# Patient Record
Sex: Female | Born: 1990 | Race: White | Hispanic: No | Marital: Single | Smoking: Never smoker
Health system: Southern US, Community
[De-identification: ages and names within clinical notes are randomized; demographics above are authoritative.]

## PROBLEM LIST (undated history)

## (undated) DIAGNOSIS — J069 Acute upper respiratory infection, unspecified: Secondary | ICD-10-CM

## (undated) DIAGNOSIS — N83209 Unspecified ovarian cyst, unspecified side: Secondary | ICD-10-CM

## (undated) DIAGNOSIS — D649 Anemia, unspecified: Secondary | ICD-10-CM

## (undated) DIAGNOSIS — K219 Gastro-esophageal reflux disease without esophagitis: Secondary | ICD-10-CM

## (undated) HISTORY — DX: Unspecified ovarian cyst, unspecified side: N83.209

## (undated) HISTORY — DX: Anemia, unspecified: D64.9

## (undated) HISTORY — DX: Gastro-esophageal reflux disease without esophagitis: K21.9

## (undated) HISTORY — DX: Acute upper respiratory infection, unspecified: J06.9

---
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
## 2017-02-07 ENCOUNTER — Encounter: Payer: Self-pay | Admitting: Allergy and Immunology

## 2017-02-07 ENCOUNTER — Ambulatory Visit (INDEPENDENT_AMBULATORY_CARE_PROVIDER_SITE_OTHER): Payer: Commercial Managed Care - PPO | Admitting: Allergy and Immunology

## 2017-02-07 VITALS — BP 118/66 | HR 72 | Temp 98.5°F | Resp 16 | Ht 66.5 in | Wt 129.8 lb

## 2017-02-07 DIAGNOSIS — K219 Gastro-esophageal reflux disease without esophagitis: Secondary | ICD-10-CM | POA: Diagnosis not present

## 2017-02-07 DIAGNOSIS — J3089 Other allergic rhinitis: Secondary | ICD-10-CM

## 2017-02-07 DIAGNOSIS — B999 Unspecified infectious disease: Secondary | ICD-10-CM

## 2017-02-07 DIAGNOSIS — J329 Chronic sinusitis, unspecified: Secondary | ICD-10-CM | POA: Diagnosis not present

## 2017-02-07 MED ORDER — PANTOPRAZOLE SODIUM 40 MG PO TBEC: 40.0000 mg | DELAYED_RELEASE_TABLET | Freq: Every day | ORAL | 5 refills | Status: DC

## 2017-02-07 MED ORDER — RANITIDINE HCL 300 MG PO TABS: 300.0000 mg | ORAL_TABLET | Freq: Every day | ORAL | 5 refills | Status: DC

## 2017-02-07 NOTE — Progress Notes (Signed)
NEW PATIENT NOTE  Referring Provider: No ref. provider found Primary Provider: Lucianne Lei, MD Date of office visit: 02/07/2017    Subjective:   Chief Complaint:  Anita Woods (DOB: 1991/03/10) is a 26 y.o. female who presents to the clinic on 02/07/2017 with a chief complaint of Sinus Problem (runny nose, chronic sinus infections, facial pain, headaces, PND); Ear Problem (itchy and hurting); and Eye Problem (watery and itchy) .  HPI: Anita Woods presents to this clinic in evaluation of persistent airway problems. She has a long history of "chronic sinusitis" manifested as recurrent episodes of sore throat and nasal congestion with mouth breathing and yellow red nasal discharge and fatigue that appears to occur about every 3 months for which she is usually treated with Kenalog and possibly some antibiotics. She's been stuck in this pattern for at least 6 years. In between these episodes she always has runny nose and some sneezing and postnasal drip. She also has intermittent sore throat. She has intermittent ear pain bilaterally without any tinnitus or hearing loss or vertigo. There is not a tremendous amount of lower airway symptoms associated with this issue. There is no obvious provoking factor giving rise to this issue.  She also has reflux that has become quite significant recently. She has reflux up into her throat even though she takes pantoprazole daily. She must add in tums. She burps a lot. She has 2 caffeinated drinks per day and does not eat chocolate or drink alcohol.  Over the course of the past 6 months or so she has had very irregular menstrual periods. She spots a lot and then she can have a very heavy flow for 2 weeks. She did stop a estrogen IUD in April. She is sexually active utilizing a condom.  Past Medical History:  Diagnosis Date  . GERD (gastroesophageal reflux disease)   . Recurrent upper respiratory infection (URI)     Past Surgical History:  Procedure  Laterality Date  . GALLBLADDER SURGERY  2011  . TONSILLECTOMY  2001    Allergies as of 02/07/2017   No Known Allergies     Medication List      cefdinir 300 MG capsule Commonly known as:  OMNICEF Take 300 mg by mouth 2 (two) times daily.   fluticasone 50 MCG/ACT nasal spray Commonly known as:  FLONASE Place 2 sprays into both nostrils 2 (two) times daily.   loratadine 10 MG tablet Commonly known as:  CLARITIN Take 10 mg by mouth daily.   montelukast 10 MG tablet Commonly known as:  SINGULAIR Take 10 mg by mouth at bedtime.   VITAMIN D2 PO Take 1.25 mg by mouth every 30 (thirty) days.       Review of systems negative except as noted in HPI / PMHx or noted below:  Review of Systems  Constitutional: Negative.   HENT: Negative.   Eyes: Negative.   Respiratory: Negative.   Cardiovascular: Negative.   Gastrointestinal: Negative.   Genitourinary: Negative.   Musculoskeletal: Negative.   Skin: Negative.   Neurological: Negative.   Endo/Heme/Allergies: Negative.   Psychiatric/Behavioral: Negative.     Family History  Problem Relation Age of Onset  . Diabetes Mother   . High blood pressure Father   . Migraines Paternal Aunt   . Cancer Paternal Aunt   . Diabetes Maternal Grandmother     Social History   Social History  . Marital status: Single    Spouse name: N/A  . Number of children: N/A  .  Years of education: N/A   Occupational History  . Not on file.   Social History Main Topics  . Smoking status: Never Smoker  . Smokeless tobacco: Never Used  . Alcohol use No  . Drug use: No  . Sexual activity: Not on file   Other Topics Concern  . Not on file   Social History Narrative  . No narrative on file    Environmental and Social history  Lives in a house with a dry environment, 2 cats located inside the household, carpeting in the bedroom, no plastic on the bed or pillow, and no smokers located inside the household. She works as a Engineer, structural.  Objective:   Vitals:   02/07/17 1351  BP: 118/66  Pulse: 72  Resp: 16  Temp: 98.5 F (36.9 C)   Height: 5' 6.5" (168.9 cm) Weight: 129 lb 12.8 oz (58.9 kg)  Physical Exam  Constitutional: She is well-developed, well-nourished, and in no distress.  HENT:  Head: Normocephalic. Head is without right periorbital erythema and without left periorbital erythema.  Right Ear: Tympanic membrane, external ear and ear canal normal.  Left Ear: Tympanic membrane, external ear and ear canal normal.  Nose: Mucosal edema ( erythematous) present. No rhinorrhea.  Mouth/Throat: Uvula is midline, oropharynx is clear and moist and mucous membranes are normal. No oropharyngeal exudate.  Eyes: Conjunctivae and lids are normal. Pupils are equal, round, and reactive to light.  Neck: Trachea normal. No tracheal tenderness present. No tracheal deviation present. No thyromegaly present.  Cardiovascular: Normal rate, regular rhythm, S1 normal, S2 normal and normal heart sounds.   No murmur heard. Pulmonary/Chest: Effort normal and breath sounds normal. No stridor. No tachypnea. No respiratory distress. She has no wheezes. She has no rales. She exhibits no tenderness.  Abdominal: Soft. She exhibits no distension and no mass. There is no hepatosplenomegaly. There is no tenderness. There is no rebound and no guarding.  Musculoskeletal: She exhibits no edema or tenderness.  Lymphadenopathy:       Head (right side): No tonsillar adenopathy present.       Head (left side): No tonsillar adenopathy present.    She has no cervical adenopathy.    She has no axillary adenopathy.  Neurological: She is alert. Gait normal.  Skin: No rash noted. She is not diaphoretic. No erythema. No pallor. Nails show no clubbing.  Psychiatric: Mood and affect normal.    Diagnostics: Allergy skin tests were performed. She did not demonstrate any hypersensitivity against a screening panel of aeroallergens or foods except for  slight hypersensitivity directed against mold.  Assessment and Plan:    1. Other allergic rhinitis   2. Chronic sinusitis, unspecified location   3. LPRD (laryngopharyngeal reflux disease)   4. Recurrent infections     1. Allergen avoidance measures  2. Treat and prevent inflammation:   A. OTC Rhinocort/Nasacort one spray each nostril one time per day  B. montelukast 10 mg tablet 1 time per day  3. Treat and prevent reflux:   A. increase pantoprazole 40 mg in a.m.  B. ranitidine 300 mg in PM  C. remain away from all caffeine consumption  4. If needed:   A. nasal saline  B. OTC antihistamine  5. Blood - CBC w/diff, IgA/G/M, IgE, anti-pneumococcal ab, antitetanus ab  6. Obtain limited sinus CT scan for chronic sinusitis  7. Return to clinic in 3 weeks or earlier if problem  I think Lurena Joiner may have a component of chronic  sinusitis and we will assess this issue by imaging her upper airways and because she has had such significant problems with recurrent infections without any significant evidence of atopic disease I'm going to check her blood to make sure we were not dealing with a B-cell immunodeficiency. I think that reflux is one of the triggers giving rise to her respiratory tract inflammation and we'll treat that condition with the therapy noted above at the same time that she uses anti-inflammatory agents for her respiratory tract consistently. I'll regroup with her in 3 weeks or earlier if there is a problem.  Laurette SchimkeEric Leandra Vanderweele, MD Allergy / Immunology [facility]

## 2017-02-07 NOTE — Patient Instructions (Addendum)
  1. Allergen avoidance measures  2. Treat and prevent inflammation:   A. OTC Rhinocort/Nasacort one spray each nostril one time per day  B. montelukast 10 mg tablet 1 time per day  3. Treat and prevent reflux:   A. increase pantoprazole 40 mg in a.m.  B. ranitidine 300 mg in PM  C. remain away from all caffeine consumption  4. If needed:   A. nasal saline  B. OTC antihistamine  5. Blood - CBC w/diff, IgA/G/M, IgE, anti-pneumococcal ab, antitetanus ab  6. Obtain limited sinus CT scan for chronic sinusitis  7. Return to clinic in 3 weeks or earlier if problem

## 2017-02-13 ENCOUNTER — Telehealth: Payer: Self-pay | Admitting: *Deleted

## 2017-02-13 LAB — CBC WITH DIFFERENTIAL/PLATELET
BASOS ABS: 0 10*3/uL (ref 0.0–0.2)
Basos: 0 %
EOS (ABSOLUTE): 0.1 10*3/uL (ref 0.0–0.4)
EOS: 1 %
HEMATOCRIT: 41 % (ref 34.0–46.6)
HEMOGLOBIN: 13.7 g/dL (ref 11.1–15.9)
IMMATURE GRANULOCYTES: 0 %
Immature Grans (Abs): 0 10*3/uL (ref 0.0–0.1)
LYMPHS ABS: 3 10*3/uL (ref 0.7–3.1)
Lymphs: 37 %
MCH: 28.8 pg (ref 26.6–33.0)
MCHC: 33.4 g/dL (ref 31.5–35.7)
MCV: 86 fL (ref 79–97)
MONOCYTES: 6 %
Monocytes Absolute: 0.5 10*3/uL (ref 0.1–0.9)
NEUTROS PCT: 56 %
Neutrophils Absolute: 4.6 10*3/uL (ref 1.4–7.0)
Platelets: 309 10*3/uL (ref 150–379)
RBC: 4.75 x10E6/uL (ref 3.77–5.28)
RDW: 13.5 % (ref 12.3–15.4)
WBC: 8.2 10*3/uL (ref 3.4–10.8)

## 2017-02-13 LAB — PNEUMOCOCCAL IM (14 SEROTYPE)
PNEUMO AB TYPE 3: 0.4 ug/mL — AB (ref >1.3)
PNEUMO AB TYPE 51 (7F): 1.1 ug/mL — AB (ref >1.3)
PNEUMO AB TYPE 56 (18C): 1.3 ug/mL — AB (ref >1.3)
PNEUMO AB TYPE 57 (19A): 2.4 ug/mL (ref >1.3)
Pneumo Ab Type 1*: 0.4 ug/mL — ABNORMAL LOW (ref >1.3)
Pneumo Ab Type 14*: 0.2 ug/mL — ABNORMAL LOW (ref >1.3)
Pneumo Ab Type 19 (19F)*: 2.7 ug/mL (ref >1.3)
Pneumo Ab Type 26 (6B)*: 8.6 ug/mL (ref >1.3)
Pneumo Ab Type 8*: <0.1 ug/mL — ABNORMAL LOW (ref >1.3)
Pneumo Ab Type 9 (9N)*: <0.1 ug/mL — ABNORMAL LOW (ref >1.3)

## 2017-02-13 LAB — IMMUNOGLOBULINS A/E/G/M, SERUM
IGA/IMMUNOGLOBULIN A, SERUM: 162 mg/dL (ref 87–352)
IGG (IMMUNOGLOBIN G), SERUM: 1036 mg/dL (ref 700–1600)
IGM (IMMUNOGLOBULIN M), SRM: 139 mg/dL (ref 26–217)
IgE (Immunoglobulin E), Serum: 33 IU/mL (ref 0–100)

## 2017-02-13 LAB — TETANUS ANTIBODY, IGG: TETANUS AB, IGG: 1.2 [IU]/mL (ref <0.10)

## 2017-02-13 NOTE — Telephone Encounter (Signed)
Informed patient of normal CT scan results per Dr. Lucie LeatherKozlow.

## 2017-02-14 ENCOUNTER — Encounter: Payer: Self-pay | Admitting: Allergy and Immunology

## 2017-02-28 ENCOUNTER — Ambulatory Visit (INDEPENDENT_AMBULATORY_CARE_PROVIDER_SITE_OTHER): Payer: Commercial Managed Care - PPO | Admitting: Allergy and Immunology

## 2017-02-28 ENCOUNTER — Encounter: Payer: Self-pay | Admitting: Allergy and Immunology

## 2017-02-28 VITALS — BP 118/72 | HR 76 | Resp 14

## 2017-02-28 DIAGNOSIS — J3089 Other allergic rhinitis: Secondary | ICD-10-CM | POA: Diagnosis not present

## 2017-02-28 DIAGNOSIS — B999 Unspecified infectious disease: Secondary | ICD-10-CM

## 2017-02-28 DIAGNOSIS — K219 Gastro-esophageal reflux disease without esophagitis: Secondary | ICD-10-CM | POA: Diagnosis not present

## 2017-02-28 NOTE — Progress Notes (Signed)
Follow-up Note  Referring Provider: Lucianne Lei, MD Primary Provider: Lucianne Lei, MD Date of Office Visit: 02/28/2017  Subjective:   Anita Woods (DOB: 01/15/1991) is a 26 y.o. female [facility] in re-evaluation of the following:  HPI: Anita Woods presents to this clinic in reevaluation of her persistent airway symptoms involving her head and throat addressed during her initial evaluation of 07 February 2017.  She is better in some regards. Her nose is doing much better at this point in time. She is not having any nasal congestion and ugly nasal discharge. Her throat is doing better. She has less throat clearing and less postnasal drip. Her ear has not been causing her any problems. Her reflux is significantly improved. She has eliminated all caffeine consumption.  Allergies as of 02/28/2017   No Known Allergies     Medication List      fluticasone 50 MCG/ACT nasal spray Commonly known as:  FLONASE Place 2 sprays into both nostrils 2 (two) times daily.   loratadine 10 MG tablet Commonly known as:  CLARITIN Take 10 mg by mouth daily.   montelukast 10 MG tablet Commonly known as:  SINGULAIR Take 10 mg by mouth at bedtime.   pantoprazole 40 MG tablet Commonly known as:  PROTONIX Take 1 tablet (40 mg total) by mouth daily. Every morning   ranitidine 300 MG tablet Commonly known as:  ZANTAC Take 1 tablet (300 mg total) by mouth at bedtime.   VITAMIN D2 PO Take 1.25 mg by mouth every 30 (thirty) days.       Past Medical History:  Diagnosis Date  . GERD (gastroesophageal reflux disease)   . Ovarian cyst   . Recurrent upper respiratory infection (URI)     Past Surgical History:  Procedure Laterality Date  . GALLBLADDER SURGERY  2011  . TONSILLECTOMY  2001    Review of systems negative except as noted in HPI / PMHx or noted below:  Review of Systems  Constitutional: Negative.   HENT: Negative.   Eyes: Negative.     Respiratory: Negative.   Cardiovascular: Negative.   Gastrointestinal: Negative.   Genitourinary: Negative.   Musculoskeletal: Negative.   Skin: Negative.   Neurological: Negative.   Endo/Heme/Allergies: Negative.   Psychiatric/Behavioral: Negative.      Objective:   Vitals:   02/28/17 1534  BP: 118/72  Pulse: 76  Resp: 14          Physical Exam  Constitutional: She is well-developed, well-nourished, and in no distress.  HENT:  Head: Normocephalic.  Right Ear: Tympanic membrane, external ear and ear canal normal.  Left Ear: Tympanic membrane, external ear and ear canal normal.  Nose: Nose normal. No mucosal edema or rhinorrhea.  Mouth/Throat: Uvula is midline, oropharynx is clear and moist and mucous membranes are normal. No oropharyngeal exudate.  Eyes: Conjunctivae are normal.  Neck: Trachea normal. No tracheal tenderness present. No tracheal deviation present. No thyromegaly present.  Cardiovascular: Normal rate, regular rhythm, S1 normal, S2 normal and normal heart sounds.   No murmur heard. Pulmonary/Chest: Breath sounds normal. No stridor. No respiratory distress. She has no wheezes. She has no rales.  Musculoskeletal: She exhibits no edema.  Lymphadenopathy:       Head (right side): No tonsillar adenopathy present.       Head (left side): No tonsillar adenopathy present.    She has no cervical adenopathy.  Neurological: She is alert. Gait normal.  Skin: No  rash noted. She is not diaphoretic. No erythema. Nails show no clubbing.  Psychiatric: Mood and affect normal.    Diagnostics: Results of blood tests obtained on 07 February 2017 identified a white blood cell count 8.2 with a total eosinophil count of 100, lymphocyte count of 3000, hemoglobin 13.7, platelet 309, adequate level of antibody directed against tetanus, low levels of antibodies directed against various serotypes of pneumococcus although there were approximately 5 serotypes with adequate  levels.  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  Assessment and Plan:   1. Other allergic rhinitis   2. LPRD (laryngopharyngeal reflux disease)   3. Recurrent infections     1. Obtain Pneumovax vaccination   2. Continue to Treat and prevent inflammation:   A. OTC Rhinocort/Nasacort one spray each nostril one time per day  B. montelukast 10 mg tablet 1 time per day  3. Continue to Treat and prevent reflux:   A. pantoprazole 40 mg in a.m.  B. ranitidine 300 mg in PM  C. remain away from all caffeine consumption  4. If needed:   A. nasal saline  B. OTC antihistamine  5. Return in Summer 2018 or earlier if problem   Anita Woods appears to be doing better for the first 3 weeks of therapy and we'll keep her on this plan for an additional 12 weeks and then make a determination about how to proceed pending her response. I think it would be worthwhile for her to receive the Pneumovax given her relatively low levels antibodies directed against this organism. She will contact me should she have significant problems that she moves forward but otherwise I will see her back in this clinic in 12 weeks.  Anita Schimke, MD Allergy / Immunology [facility]

## 2017-02-28 NOTE — Patient Instructions (Signed)
  1. Obtain Pneumovax vaccination   2. Continue to Treat and prevent inflammation:   A. OTC Rhinocort/Nasacort one spray each nostril one time per day  B. montelukast 10 mg tablet 1 time per day  3. Continue to Treat and prevent reflux:   A. pantoprazole 40 mg in a.m.  B. ranitidine 300 mg in PM  C. remain away from all caffeine consumption  4. If needed:   A. nasal saline  B. OTC antihistamine  5. Return in Summer 2018 or earlier if problem

## 2017-07-26 ENCOUNTER — Other Ambulatory Visit: Payer: Self-pay | Admitting: *Deleted

## 2017-07-26 MED ORDER — PANTOPRAZOLE SODIUM 40 MG PO TBEC: DELAYED_RELEASE_TABLET | ORAL | 5 refills | Status: DC

## 2017-08-26 ENCOUNTER — Other Ambulatory Visit: Payer: Self-pay | Admitting: *Deleted

## 2017-08-26 MED ORDER — RANITIDINE HCL 300 MG PO TABS: 300.0000 mg | ORAL_TABLET | Freq: Every day | ORAL | 5 refills | Status: DC

## 2018-04-02 ENCOUNTER — Telehealth: Payer: Self-pay

## 2018-04-02 NOTE — Telephone Encounter (Signed)
RF on Pantoprazole denied. Due to appt

## 2020-12-29 DIAGNOSIS — R42 Dizziness and giddiness: Secondary | ICD-10-CM | POA: Diagnosis not present

## 2020-12-29 DIAGNOSIS — G43909 Migraine, unspecified, not intractable, without status migrainosus: Secondary | ICD-10-CM | POA: Diagnosis not present

## 2020-12-29 DIAGNOSIS — Z6821 Body mass index (BMI) 21.0-21.9, adult: Secondary | ICD-10-CM | POA: Diagnosis not present

## 2021-01-05 DIAGNOSIS — H6990 Unspecified Eustachian tube disorder, unspecified ear: Secondary | ICD-10-CM | POA: Diagnosis not present

## 2021-01-05 DIAGNOSIS — Z20828 Contact with and (suspected) exposure to other viral communicable diseases: Secondary | ICD-10-CM | POA: Diagnosis not present

## 2021-01-05 DIAGNOSIS — G43119 Migraine with aura, intractable, without status migrainosus: Secondary | ICD-10-CM | POA: Diagnosis not present

## 2021-01-09 DIAGNOSIS — R42 Dizziness and giddiness: Secondary | ICD-10-CM | POA: Diagnosis not present

## 2021-01-09 DIAGNOSIS — Z6821 Body mass index (BMI) 21.0-21.9, adult: Secondary | ICD-10-CM | POA: Diagnosis not present

## 2021-01-09 DIAGNOSIS — H9202 Otalgia, left ear: Secondary | ICD-10-CM | POA: Diagnosis not present

## 2021-01-12 DIAGNOSIS — N3001 Acute cystitis with hematuria: Secondary | ICD-10-CM | POA: Diagnosis not present

## 2021-01-12 DIAGNOSIS — N3091 Cystitis, unspecified with hematuria: Secondary | ICD-10-CM | POA: Diagnosis not present

## 2021-01-12 DIAGNOSIS — R509 Fever, unspecified: Secondary | ICD-10-CM | POA: Diagnosis not present

## 2021-01-12 DIAGNOSIS — M791 Myalgia, unspecified site: Secondary | ICD-10-CM | POA: Diagnosis not present

## 2021-01-12 DIAGNOSIS — R070 Pain in throat: Secondary | ICD-10-CM | POA: Diagnosis not present

## 2021-01-12 DIAGNOSIS — Z20828 Contact with and (suspected) exposure to other viral communicable diseases: Secondary | ICD-10-CM | POA: Diagnosis not present

## 2021-03-23 DIAGNOSIS — Z01419 Encounter for gynecological examination (general) (routine) without abnormal findings: Secondary | ICD-10-CM | POA: Diagnosis not present

## 2021-03-23 DIAGNOSIS — Z113 Encounter for screening for infections with a predominantly sexual mode of transmission: Secondary | ICD-10-CM | POA: Diagnosis not present

## 2021-03-23 DIAGNOSIS — N939 Abnormal uterine and vaginal bleeding, unspecified: Secondary | ICD-10-CM | POA: Diagnosis not present

## 2021-03-23 DIAGNOSIS — Z6821 Body mass index (BMI) 21.0-21.9, adult: Secondary | ICD-10-CM | POA: Diagnosis not present

## 2021-05-16 DIAGNOSIS — Z79899 Other long term (current) drug therapy: Secondary | ICD-10-CM | POA: Diagnosis not present

## 2021-05-16 DIAGNOSIS — R509 Fever, unspecified: Secondary | ICD-10-CM | POA: Diagnosis not present

## 2021-05-16 DIAGNOSIS — J029 Acute pharyngitis, unspecified: Secondary | ICD-10-CM | POA: Diagnosis not present

## 2021-05-16 DIAGNOSIS — R6883 Chills (without fever): Secondary | ICD-10-CM | POA: Diagnosis not present

## 2021-05-16 DIAGNOSIS — U071 COVID-19: Secondary | ICD-10-CM | POA: Diagnosis not present

## 2021-06-21 DIAGNOSIS — Z20828 Contact with and (suspected) exposure to other viral communicable diseases: Secondary | ICD-10-CM | POA: Diagnosis not present

## 2021-06-21 DIAGNOSIS — R059 Cough, unspecified: Secondary | ICD-10-CM | POA: Diagnosis not present

## 2021-06-21 DIAGNOSIS — R300 Dysuria: Secondary | ICD-10-CM | POA: Diagnosis not present

## 2021-06-21 DIAGNOSIS — N3091 Cystitis, unspecified with hematuria: Secondary | ICD-10-CM | POA: Diagnosis not present

## 2021-08-22 DIAGNOSIS — J020 Streptococcal pharyngitis: Secondary | ICD-10-CM | POA: Diagnosis not present

## 2021-10-15 DIAGNOSIS — J111 Influenza due to unidentified influenza virus with other respiratory manifestations: Secondary | ICD-10-CM | POA: Diagnosis not present

## 2021-11-25 DIAGNOSIS — F4321 Adjustment disorder with depressed mood: Secondary | ICD-10-CM | POA: Diagnosis not present

## 2021-12-09 DIAGNOSIS — F4321 Adjustment disorder with depressed mood: Secondary | ICD-10-CM | POA: Diagnosis not present

## 2021-12-18 DIAGNOSIS — Z20822 Contact with and (suspected) exposure to covid-19: Secondary | ICD-10-CM | POA: Diagnosis not present

## 2021-12-18 DIAGNOSIS — R059 Cough, unspecified: Secondary | ICD-10-CM | POA: Diagnosis not present

## 2021-12-18 DIAGNOSIS — J101 Influenza due to other identified influenza virus with other respiratory manifestations: Secondary | ICD-10-CM | POA: Diagnosis not present

## 2021-12-26 DIAGNOSIS — Z3202 Encounter for pregnancy test, result negative: Secondary | ICD-10-CM | POA: Diagnosis not present

## 2021-12-26 DIAGNOSIS — N939 Abnormal uterine and vaginal bleeding, unspecified: Secondary | ICD-10-CM | POA: Diagnosis not present

## 2021-12-28 ENCOUNTER — Encounter: Payer: Self-pay | Admitting: Emergency Medicine

## 2021-12-28 ENCOUNTER — Other Ambulatory Visit: Payer: Self-pay

## 2021-12-28 ENCOUNTER — Emergency Department
Admission: EM | Admit: 2021-12-28 | Discharge: 2021-12-28 | Disposition: A | Discharge status: Home / Self Care | Payer: BC Managed Care – PPO | Attending: Emergency Medicine | Admitting: Emergency Medicine

## 2021-12-28 ENCOUNTER — Emergency Department: Payer: BC Managed Care – PPO

## 2021-12-28 DIAGNOSIS — R0602 Shortness of breath: Secondary | ICD-10-CM | POA: Diagnosis not present

## 2021-12-28 DIAGNOSIS — D72829 Elevated white blood cell count, unspecified: Secondary | ICD-10-CM | POA: Insufficient documentation

## 2021-12-28 DIAGNOSIS — D649 Anemia, unspecified: Secondary | ICD-10-CM | POA: Diagnosis not present

## 2021-12-28 DIAGNOSIS — N939 Abnormal uterine and vaginal bleeding, unspecified: Secondary | ICD-10-CM | POA: Insufficient documentation

## 2021-12-28 LAB — BASIC METABOLIC PANEL
Anion gap: 8 (ref 5–15)
BUN: 12 mg/dL (ref 6–20)
CO2: 24 mmol/L (ref 22–32)
Calcium: 9.5 mg/dL (ref 8.9–10.3)
Chloride: 104 mmol/L (ref 98–111)
Creatinine, Ser: 0.58 mg/dL (ref 0.44–1.00)
GFR, Estimated: >60 mL/min (ref >60)
Glucose, Bld: 99 mg/dL (ref 70–99)
Potassium: 4.6 mmol/L (ref 3.5–5.1)
Sodium: 136 mmol/L (ref 135–145)

## 2021-12-28 LAB — CBC
HCT: 36.6 % (ref 36.0–46.0)
Hemoglobin: 11.6 g/dL — ABNORMAL LOW (ref 12.0–15.0)
MCH: 26.9 pg (ref 26.0–34.0)
MCHC: 31.7 g/dL (ref 30.0–36.0)
MCV: 84.7 fL (ref 80.0–100.0)
Platelets: 276 10*3/uL (ref 150–400)
RBC: 4.32 MIL/uL (ref 3.87–5.11)
RDW: 13 % (ref 11.5–15.5)
WBC: 9.5 10*3/uL (ref 4.0–10.5)
nRBC: 0 % (ref 0.0–0.2)

## 2021-12-28 MED ORDER — MEDROXYPROGESTERONE ACETATE 5 MG PO TABS
10.0000 mg | ORAL_TABLET | Freq: Every day | ORAL | 0 refills | Status: DC
Start: 2021-12-28 — End: 2021-12-28

## 2021-12-28 MED ORDER — MEDROXYPROGESTERONE ACETATE 5 MG PO TABS
10.0000 mg | ORAL_TABLET | Freq: Every day | ORAL | 0 refills | Status: DC
Start: 2021-12-28 — End: 2022-12-21

## 2021-12-28 NOTE — ED Triage Notes (Signed)
PT comes into the ED via POV c/o vaginal bleeding that started Monday.  Pt states that she is passing large clots the size of her hand.  Pt admits to weakness since then started as well as abd pain.  Pt went to [facility] Tuesday and they completed blood work but they havent called her with the results at this time.  They also ran a POC preg test that was negative.  PT states she normally has irregular periods so there Is no way to know if this is her cycle for the month.  Pt in NAd at this time with even and unlabored respirations.

## 2021-12-28 NOTE — ED Notes (Signed)
See triage note  presents with some vaginal bleeding  states she is passing clots and feels weak

## 2021-12-28 NOTE — ED Notes (Signed)
ED POC urine preg Negative

## 2021-12-28 NOTE — ED Provider Notes (Signed)
[facility] Provider Note    Event Date/Time   First MD Initiated Contact with Patient 12/28/21 1120     (approximate)   History   Chief Complaint Vaginal Bleeding   HPI Anita Woods is a 31 y.o. female, history of ovarian cyst, GERD, presents to the emergency department for evaluation of vaginal bleeding.  Patient states that 3 days ago, she passed a large blood clot in the shower reportedly the size of her fist.  Since then she has had multiple blood clots passing intermittently throughout the day.  Reportedly feels weak and mildly short of breath.  She states that [facility] test and did a urine pregnancy, but reportedly no limitations only told her that she was not pregnant and discharged her.  Denies fever/chills, chest pain, abdominal pain, pelvic pain, urinary symptoms, back pain, vomiting, diarrhea, or new rashes or lesions.  History Limitations: No limitations.      Physical Exam  Triage Vital Signs: ED Triage Vitals  Enc Vitals Group     BP 12/28/21 1113 126/76     Pulse Rate 12/28/21 1113 (!) 103     Resp 12/28/21 1113 18     Temp 12/28/21 1113 97.9 F (36.6 C)     Temp Source 12/28/21 1113 Oral     SpO2 12/28/21 1113 100 %     Weight 12/28/21 1114 138 lb (62.6 kg)     Height 12/28/21 1114 5\' 8"  (1.727 m)     Head Circumference --      Peak Flow --      Pain Score 12/28/21 1114 8     Pain Loc --      Pain Edu? --      Excl. in GC? --     Most recent vital signs: Vitals:   12/28/21 1113 12/28/21 1429  BP: 126/76 120/70  Pulse: (!) 103 98  Resp: 18 18  Temp: 97.9 F (36.6 C)   SpO2: 100% 99%    General: Awake, NAD.  CV: Good peripheral perfusion.  Resp: Normal effort.  Abd: Soft, non-tender. No distention.  No CVA tenderness Neuro: At baseline. No gross neurological deficits. Other: Small amount of blood noted in the vaginal vault.  No cervical dilation.  No active bleeding or  discharge.  Cervix nonfriable.  Physical Exam    ED Results / Procedures / Treatments  Labs (all labs ordered are listed, but only abnormal results are displayed) Labs Reviewed  CBC - Abnormal; Notable for the following components:      Result Value   Hemoglobin 11.6 (*)    All other components within normal limits  BASIC METABOLIC PANEL  POC URINE PREG, ED     EKG Not applicable.  None.   RADIOLOGY  ED Provider Interpretation: I personally reviewed and interpreted this image.  No acute findings.  02/25/22 PELVIC COMPLETE W TRANSVAGINAL AND TORSION R/O  Result Date: 12/28/2021 CLINICAL DATA:  Vaginal bleeding. EXAM: TRANSABDOMINAL AND TRANSVAGINAL ULTRASOUND OF PELVIS DOPPLER ULTRASOUND OF OVARIES TECHNIQUE: Both transabdominal and transvaginal ultrasound examinations of the pelvis were performed. Transabdominal technique was performed for global imaging of the pelvis including uterus, ovaries, adnexal regions, and pelvic cul-de-sac. It was necessary to proceed with endovaginal exam following the transabdominal exam to visualize the endometrium and ovaries. Color and duplex Doppler ultrasound was utilized to evaluate blood flow to the ovaries. COMPARISON:  Mar 19, 2013. FINDINGS: Uterus Measurements: 8.2 x 4.6  x 3.5 cm = volume: 69 mL. No fibroids or other mass visualized. Endometrium Thickness: 3 mm which is within normal limits. No focal abnormality visualized. Right ovary Measurements: 3.0 x 2.8 x 2.4 cm = volume: 11 mL. Normal appearance/no adnexal mass. Left ovary Measurements: 3.4 x 2.0 x 1.9 cm = volume: 7 mL. Normal appearance/no adnexal mass. Pulsed Doppler evaluation of both ovaries demonstrates normal low-resistance arterial and venous waveforms. Other findings No abnormal free fluid. IMPRESSION: No evidence of ovarian mass or torsion. No definite abnormality seen in the pelvis. Electronically Signed   By: Lupita Raider M.D.   On: 12/28/2021 15:16    PROCEDURES:  Critical Care  performed: None.  Procedures    MEDICATIONS ORDERED IN ED: Medications - No data to display   IMPRESSION / MDM / ASSESSMENT AND PLAN / ED COURSE  I reviewed the triage vital signs and the nursing notes.                              Anita Woods is a 31 y.o. female, history of ovarian cyst, GERD, presents to the emergency department for evaluation of vaginal bleeding.  Patient states that 3 days ago, she passed a large blood clot in the shower reportedly the size of her fist.  Since then she has had multiple blood clots passing intermittently throughout the day.  Reportedly feels weak and mildly short of breath  Differential diagnosis includes, but is not limited to, vaginal trauma, cervical polyp, PCOS, STI, fibroids, spontaneous abortion   ED Course Patient appears well.  Vitals within normal limits.  She is afebrile.  Leukocytosis notable for mild anemia at 11.6 hemoglobin.  Likely explains her weakness/shortness of breath.  No leukocytosis  BMP unremarkable for electrolyte abnormalities or evidence of kidney injury.   Assessment/Plan Given the patient's history, physical exam, and work-up thus far, I do not suspect any serious or life-threatening pathology.  Ultrasound and lab work is reassuring.  She is mildly anemic with symptoms.  We will provide her with a prescription for Provera.  We will plan to discharge this patient with OB/GYN follow-up.  Patient was provided with anticipatory guidance, return precautions, and educational material. Encouraged the patient to return to the emergency department at any time if they begin to experience any new or worsening symptoms.       FINAL CLINICAL IMPRESSION(S) / ED DIAGNOSES   Final diagnoses:  Vaginal bleeding     Rx / DC Orders   ED Discharge Orders          Ordered    medroxyPROGESTERone (PROVERA) 5 MG tablet  Daily,   Status:  Discontinued        12/28/21 1548    medroxyPROGESTERone (PROVERA) 5 MG tablet  Daily,    Status:  Discontinued        12/28/21 1607    medroxyPROGESTERone (PROVERA) 5 MG tablet  Daily        12/28/21 1614             Note:  This document was prepared using Dragon voice recognition software and may include unintentional dictation errors.   Varney Daily, [place] 12/28/21 2022    Delton Prairie, MD 01/03/22 [phone]

## 2021-12-28 NOTE — Discharge Instructions (Addendum)
-  Follow-up with the OB/GYN provider listed above. -Take your medication as prescribed. -Return to the emergency department at any time if you begin to experience any new or worsening symptoms

## 2022-01-02 ENCOUNTER — Other Ambulatory Visit: Payer: Self-pay

## 2022-01-02 ENCOUNTER — Ambulatory Visit: Payer: BC Managed Care – PPO | Admitting: Obstetrics and Gynecology

## 2022-01-02 ENCOUNTER — Encounter: Payer: Self-pay | Admitting: Obstetrics and Gynecology

## 2022-01-02 VITALS — BP 124/70 | HR 96 | Ht 68.0 in | Wt 141.3 lb

## 2022-01-02 DIAGNOSIS — N939 Abnormal uterine and vaginal bleeding, unspecified: Secondary | ICD-10-CM | POA: Diagnosis not present

## 2022-01-02 DIAGNOSIS — Z30011 Encounter for initial prescription of contraceptive pills: Secondary | ICD-10-CM

## 2022-01-02 MED ORDER — LO LOESTRIN FE 1 MG-10 MCG / 10 MCG PO TABS: 1.0000 | ORAL_TABLET | Freq: Every day | ORAL | 0 refills | Status: DC

## 2022-01-02 NOTE — Progress Notes (Signed)
HPI:      [name] is a 31 y.o. G0P0000 who LMP was Patient's last menstrual period was 12/02/2021 (exact date).  Subjective:   She presents today because she had a very heavy menstrual period with clots and cramping and presented to the emergency department.  Her hemoglobin was found to be 11.6 at that time.  Her pregnancy test was negative and ultrasound showed no abnormalities.  Since that time her bleeding has resolved. She reports that she generally has normal regular cycles with 1 episode of bleeding per month.  She does report that these do not occur at the same time of the month all the time and that sometimes her menses is preceded by several days of spotting prior to. She is sexually active and is using condoms regularly for birth control.  She has been on OCPs before and she has had success taking them.  She stopped OCPs because she thought she would "give her body a break from the hormones".  She states that she has also tried Mirena in the past and was unhappy with that as birth control.    Hx: The following portions of the patient's history were reviewed and updated as appropriate:             She  has a past medical history of GERD (gastroesophageal reflux disease), Ovarian cyst, and Recurrent upper respiratory infection (URI). She does not have a problem list on file. She  has a past surgical history that includes Tonsillectomy (2001) and Gallbladder surgery (2011). Her family history includes Cancer in her paternal aunt; Diabetes in her maternal grandmother and mother; High blood pressure in her father; Migraines in her paternal aunt. She  reports that she has never smoked. She has never used smokeless tobacco. She reports that she does not drink alcohol and does not use drugs. She has a current medication list which includes the following prescription(s): ascorbic acid, ferrous sulfate, lo loestrin fe, omeprazole, ergocalciferol, fluticasone, loratadine,  medroxyprogesterone, montelukast, pantoprazole, and ranitidine. She has No Known Allergies.       Review of Systems:  Review of Systems  Constitutional: Denied constitutional symptoms, night sweats, recent illness, fatigue, fever, insomnia and weight loss.  Eyes: Denied eye symptoms, eye pain, photophobia, vision change and visual disturbance.  Ears/Nose/Throat/Neck: Denied ear, nose, throat or neck symptoms, hearing loss, nasal discharge, sinus congestion and sore throat.  Cardiovascular: Denied cardiovascular symptoms, arrhythmia, chest pain/pressure, edema, exercise intolerance, orthopnea and palpitations.  Respiratory: Denied pulmonary symptoms, asthma, pleuritic pain, productive sputum, cough, dyspnea and wheezing.  Gastrointestinal: Denied, gastro-esophageal reflux, melena, nausea and vomiting.  Genitourinary: See HPI for additional information.  Musculoskeletal: Denied musculoskeletal symptoms, stiffness, swelling, muscle weakness and myalgia.  Dermatologic: Denied dermatology symptoms, rash and scar.  Neurologic: Denied neurology symptoms, dizziness, headache, neck pain and syncope.  Psychiatric: Denied psychiatric symptoms, anxiety and depression.  Endocrine: Denied endocrine symptoms including hot flashes and night sweats.   Meds:   Current Outpatient Medications on File Prior to Visit  Medication Sig Dispense Refill   Ascorbic Acid (VITAMIN C PO) Take by mouth.     Ferrous Sulfate (IRON PO) Take by mouth.     OMEPRAZOLE PO Take 20 mg by mouth.     Ergocalciferol (VITAMIN D2 PO) Take 1.25 mg by mouth every 30 (thirty) days. (Patient not taking: Reported on 01/02/2022)     fluticasone (FLONASE) 50 MCG/ACT nasal spray Place 2 sprays into both nostrils 2 (two) times daily. (Patient not taking:  Reported on 01/02/2022)     loratadine (CLARITIN) 10 MG tablet Take 10 mg by mouth daily. (Patient not taking: Reported on 01/02/2022)     medroxyPROGESTERone (PROVERA) 5 MG tablet Take 2  tablets (10 mg total) by mouth daily for 10 days. (Patient not taking: Reported on 01/02/2022) 20 tablet 0   montelukast (SINGULAIR) 10 MG tablet Take 10 mg by mouth at bedtime. (Patient not taking: Reported on 01/02/2022)     pantoprazole (PROTONIX) 40 MG tablet Take one tablet once daily as directed (Patient not taking: Reported on 01/02/2022) 30 tablet 5   ranitidine (ZANTAC) 300 MG tablet Take 1 tablet (300 mg total) by mouth at bedtime. (Patient not taking: Reported on 01/02/2022) 30 tablet 5   No current facility-administered medications on file prior to visit.      Objective:     Vitals:   01/02/22 1329  BP: 124/70  Pulse: 96   Filed Weights   01/02/22 1329  Weight: 141 lb 4.8 oz (64.1 kg)              Ultrasound results and ED results reviewed in detail          Assessment:    G0P0000 There are no problems to display for this patient.    1. Abnormal vaginal bleeding   2. Initiation of OCP (BCP)     Patient with a single episode of heavy bleeding resulting in a slight drop in hemoglobin.  This has now resolved. After a long discussion regarding the likelihood that she is ovulating and hormonally normal and the risks and benefits of OCPs versus other hormonal methods of birth control she has decided that she would like better cycle control. I do not think an extended work-up is necessary at this time.   Plan:            1.  To try to achieve better cycle control she will restart OCPs and see if she has happy being back on them.  Her goal is to have better cycle control alleviate the spotting and to not have any further episodes of heavy menstrual bleeding. She will follow-up in 3 months when her annual examination is due and we will do that as well as discussing the success of her taking OCPs. OCPs The risks /benefits of OCPs have been explained to the patient in detail.  Product literature has been given to her where appropriate.  I have instructed her in the use of  OCPs.  I have explained to the patient that OCPs are not as effective for birth control during the first month of use, and that another form of contraception should be used during this time.  Both first-day start and Sunday start have been explained.  The risks and benefits of each was discussed.  She has been made aware of  the fact that in rare circumstances, other medications may affect the efficacy of OCPs.  I have answered all of her questions, and I believe that she has an understanding of the effectiveness and use of OCPs.  Orders No orders of the defined types were placed in this encounter.    Meds ordered this encounter  Medications   Norethindrone-Ethinyl Estradiol-Fe Biphas (LO LOESTRIN FE) 1 MG-10 MCG / 10 MCG tablet    Sig: Take 1 tablet by mouth at bedtime for 28 days.    Dispense:  84 tablet    Refill:  0      F/U  No follow-ups on  file. I spent 32 minutes involved in the care of this patient preparing to see the patient by obtaining and reviewing her medical history (including labs, imaging tests and prior procedures), documenting clinical information in the electronic health record (EHR), counseling and coordinating care plans, writing and sending prescriptions, ordering tests or procedures and in direct communicating with the patient and medical staff discussing pertinent items from her history and physical exam.  Finis Bud, M.D. 01/02/2022 2:23 PM

## 2022-01-02 NOTE — Progress Notes (Signed)
Patient presents today for a ED follow-up due to abnormal bleeding. Patient states on 2/6 she began to bleed heavily and pass large clots. Patient states during this time feeling lightheaded, weak and nauseus. Patient states on 2/10 the bleeding became light and ended on 2/12. Patient states she normally experiences irregular periods but never something this heavy or painful. Patient states no further questions.

## 2022-01-27 DIAGNOSIS — F4321 Adjustment disorder with depressed mood: Secondary | ICD-10-CM | POA: Diagnosis not present

## 2022-02-14 ENCOUNTER — Encounter: Payer: BC Managed Care – PPO | Admitting: Obstetrics and Gynecology

## 2022-03-20 DIAGNOSIS — J00 Acute nasopharyngitis [common cold]: Secondary | ICD-10-CM | POA: Diagnosis not present

## 2022-03-21 DIAGNOSIS — J069 Acute upper respiratory infection, unspecified: Secondary | ICD-10-CM | POA: Diagnosis not present

## 2022-03-21 DIAGNOSIS — R000 Tachycardia, unspecified: Secondary | ICD-10-CM | POA: Diagnosis not present

## 2022-03-21 DIAGNOSIS — Z03818 Encounter for observation for suspected exposure to other biological agents ruled out: Secondary | ICD-10-CM | POA: Diagnosis not present

## 2022-03-24 ENCOUNTER — Other Ambulatory Visit: Payer: Self-pay | Admitting: Obstetrics and Gynecology

## 2022-03-24 DIAGNOSIS — Z30011 Encounter for initial prescription of contraceptive pills: Secondary | ICD-10-CM

## 2022-03-24 DIAGNOSIS — N939 Abnormal uterine and vaginal bleeding, unspecified: Secondary | ICD-10-CM

## 2022-04-03 ENCOUNTER — Encounter: Payer: BC Managed Care – PPO | Admitting: Obstetrics and Gynecology

## 2022-04-11 DIAGNOSIS — N3091 Cystitis, unspecified with hematuria: Secondary | ICD-10-CM | POA: Diagnosis not present

## 2022-04-11 DIAGNOSIS — R3121 Asymptomatic microscopic hematuria: Secondary | ICD-10-CM | POA: Diagnosis not present

## 2022-04-17 DIAGNOSIS — F4321 Adjustment disorder with depressed mood: Secondary | ICD-10-CM | POA: Diagnosis not present

## 2022-04-24 DIAGNOSIS — F4321 Adjustment disorder with depressed mood: Secondary | ICD-10-CM | POA: Diagnosis not present

## 2022-10-29 ENCOUNTER — Encounter: Payer: Self-pay | Admitting: Emergency Medicine

## 2022-10-29 ENCOUNTER — Other Ambulatory Visit: Payer: Self-pay

## 2022-10-29 ENCOUNTER — Ambulatory Visit
Admission: EM | Admit: 2022-10-29 | Discharge: 2022-10-29 | Disposition: A | Discharge status: Home / Self Care | Payer: 59 | Attending: Family Medicine | Admitting: Family Medicine

## 2022-10-29 ENCOUNTER — Ambulatory Visit: Admit: 2022-10-29 | Discharge status: Absent without leave | Payer: BC Managed Care – PPO

## 2022-10-29 DIAGNOSIS — J029 Acute pharyngitis, unspecified: Secondary | ICD-10-CM | POA: Diagnosis present

## 2022-10-29 DIAGNOSIS — Z1152 Encounter for screening for COVID-19: Secondary | ICD-10-CM | POA: Diagnosis not present

## 2022-10-29 DIAGNOSIS — R52 Pain, unspecified: Secondary | ICD-10-CM | POA: Diagnosis not present

## 2022-10-29 DIAGNOSIS — R059 Cough, unspecified: Secondary | ICD-10-CM | POA: Diagnosis present

## 2022-10-29 LAB — POCT INFLUENZA A/B
Influenza A, POC: NEGATIVE
Influenza B, POC: NEGATIVE

## 2022-10-29 LAB — RESP PANEL BY RT-PCR (FLU A&B, COVID) ARPGX2
Influenza A by PCR: NEGATIVE
Influenza B by PCR: NEGATIVE
SARS Coronavirus 2 by RT PCR: NEGATIVE

## 2022-10-29 LAB — POCT RAPID STREP A (OFFICE): Rapid Strep A Screen: NEGATIVE

## 2022-10-29 NOTE — ED Provider Notes (Signed)
[facility]    CSN: [id number] Arrival date & time: 10/29/22  1207      History   Chief Complaint Chief Complaint  Patient presents with   Generalized Body Aches    HPI Anita Woods is a 31 y.o. female.   HPI 31 year old female presents with generalized body aches, sore throat, cough, fatigue and chills that started today.  Patient is vaccinated for COVID-19 and not influenza this year.   Past Medical History:  Diagnosis Date   GERD (gastroesophageal reflux disease)    Ovarian cyst    Recurrent upper respiratory infection (URI)     There are no problems to display for this patient.   Past Surgical History:  Procedure Laterality Date   GALLBLADDER SURGERY  2011   TONSILLECTOMY  2001    OB History     Gravida  0   Para  0   Term  0   Preterm  0   AB  0   Living  0      SAB  0   IAB  0   Ectopic  0   Multiple  0   Live Births  0            Home Medications    Prior to Admission medications   Medication Sig Start Date End Date Taking? Authorizing Provider  pantoprazole (PROTONIX) 20 MG tablet Take 20 mg by mouth daily.   Yes [provider]  Ascorbic Acid (VITAMIN C PO) Take by mouth.    [provider]  Ergocalciferol (VITAMIN D2 PO) Take 1.25 mg by mouth every 30 (thirty) days. Patient not taking: Reported on 01/02/2022    [provider]  Ferrous Sulfate (IRON PO) Take by mouth.    [provider]  fluticasone (FLONASE) 50 MCG/ACT nasal spray Place 2 sprays into both nostrils 2 (two) times daily. Patient not taking: Reported on 01/02/2022    [provider]  loratadine (CLARITIN) 10 MG tablet Take 10 mg by mouth daily. Patient not taking: Reported on 01/02/2022    [provider]  medroxyPROGESTERone (PROVERA) 5 MG tablet Take 2 tablets (10 mg total) by mouth daily for 10 days. Patient not taking: Reported on 01/02/2022 12/28/21 01/07/22  Menshew, Charlesetta Ivory, PA-C   montelukast (SINGULAIR) 10 MG tablet Take 10 mg by mouth at bedtime. Patient not taking: Reported on 01/02/2022    [provider]  OMEPRAZOLE PO Take 20 mg by mouth.    [provider]  pantoprazole (PROTONIX) 40 MG tablet Take one tablet once daily as directed Patient not taking: Reported on 01/02/2022 07/26/17   Jessica Priest, MD  ranitidine (ZANTAC) 300 MG tablet Take 1 tablet (300 mg total) by mouth at bedtime. Patient not taking: Reported on 01/02/2022 08/26/17   Jessica Priest, MD    Family History Family History  Problem Relation Age of Onset   Diabetes Mother    High blood pressure Father    Migraines Paternal Aunt    Cancer Paternal Aunt    Diabetes Maternal Grandmother     Social History Social History   Tobacco Use   Smoking status: Never   Smokeless tobacco: Never  Vaping Use   Vaping Use: Never used  Substance Use Topics   Alcohol use: No   Drug use: No     Allergies   Patient has no known allergies.   Review of Systems Review of Systems  Constitutional:  Positive for chills,  fatigue and fever.  Musculoskeletal:  Positive for myalgias.  All other systems reviewed and are negative.    Physical Exam Triage Vital Signs ED Triage Vitals  Enc Vitals Group     BP 10/29/22 1250 122/77     Pulse Rate 10/29/22 1250 97     Resp 10/29/22 1250 16     Temp 10/29/22 1250 99.1 F (37.3 C)     Temp Source 10/29/22 1250 Oral     SpO2 10/29/22 1250 100 %     Weight 10/29/22 1251 140 lb (63.5 kg)     Height 10/29/22 1251 5\' 8"  (1.727 m)     Head Circumference --      Peak Flow --      Pain Score 10/29/22 1251 7     Pain Loc --      Pain Edu? --      Excl. in GC? --    No data found.  Updated Vital Signs BP 122/77 (BP Location: Left Arm)   Pulse 97   Temp 99.1 F (37.3 C) (Oral)   Resp 16   Ht 5\' 8"  (1.727 m)   Wt 140 lb (63.5 kg)   SpO2 100%   BMI 21.29 kg/m      Physical Exam Vitals and nursing note reviewed.   Constitutional:      General: She is not in acute distress.    Appearance: Normal appearance. She is normal weight. She is not ill-appearing.  HENT:     Head: Normocephalic and atraumatic.     Right Ear: Tympanic membrane, ear canal and external ear normal.     Left Ear: Tympanic membrane, ear canal and external ear normal.     Mouth/Throat:     Mouth: Mucous membranes are moist.     Pharynx: Oropharynx is clear.  Eyes:     Extraocular Movements: Extraocular movements intact.     Conjunctiva/sclera: Conjunctivae normal.     Pupils: Pupils are equal, round, and reactive to light.  Cardiovascular:     Rate and Rhythm: Normal rate and regular rhythm.     Pulses: Normal pulses.     Heart sounds: Normal heart sounds.  Pulmonary:     Effort: Pulmonary effort is normal.     Breath sounds: Normal breath sounds. No wheezing, rhonchi or rales.  Musculoskeletal:        General: Normal range of motion.     Cervical back: Normal range of motion and neck supple.  Skin:    General: Skin is warm and dry.  Neurological:     General: No focal deficit present.     Mental Status: She is alert and oriented to person, place, and time. Mental status is at baseline.      UC Treatments / Results  Labs (all labs ordered are listed, but only abnormal results are displayed) Labs Reviewed  RESP PANEL BY RT-PCR (FLU A&B, COVID) [id number]  POCT INFLUENZA A/B  POCT RAPID STREP A (OFFICE)    EKG   Radiology No results found.  Procedures Procedures (including critical care time)  Medications Ordered in UC Medications - No data to display  Initial Impression / Assessment and Plan / UC Course  I have reviewed the triage vital signs and the nursing notes.  Pertinent labs & imaging results that were available during my care of the patient were reviewed by me and considered in my medical decision making (see chart for details).     MDM: 1. Body aches-[id number]  ordered. Advised patient will  follow-up with Influenza/COVID-19 results once received. Advised may take OTC Tylenol 1000 mg every 6 hours for fever/body aches. Encouraged patient to increase daily water intake to 64 ounces per day while taking these medications.   Final Clinical Impressions(s) / UC Diagnoses   Final diagnoses:  Body aches     Discharge Instructions      Advised patient will follow-up with Influenza/COVID-19 results once received. Advised may take OTC Tylenol 1000 mg every 6 hours for fever/body aches. Encouraged patient to increase daily water intake to 64 ounces per day while taking these medications.      ED Prescriptions   None    PDMP not reviewed this encounter.   Trevor Iha, FNP 10/29/22 1426

## 2022-10-29 NOTE — ED Triage Notes (Signed)
Body aches, sore throat, cough, fatigue, chills x started today Vaccinated forCovid, No Flu shot

## 2022-10-29 NOTE — Discharge Instructions (Addendum)
Advised patient will follow-up with Influenza/COVID-19 results once received. Advised may take OTC Tylenol 1000 mg every 6 hours for fever/body aches. Encouraged patient to increase daily water intake to 64 ounces per day while taking these medications.

## 2022-12-21 ENCOUNTER — Ambulatory Visit: Payer: PRIVATE HEALTH INSURANCE | Admitting: Nurse Practitioner

## 2022-12-21 ENCOUNTER — Ambulatory Visit (INDEPENDENT_AMBULATORY_CARE_PROVIDER_SITE_OTHER): Payer: 59 | Admitting: Nurse Practitioner

## 2022-12-21 VITALS — BP 136/76 | HR 100 | Temp 98.1°F | Ht 68.0 in | Wt 146.5 lb

## 2022-12-21 DIAGNOSIS — Z1159 Encounter for screening for other viral diseases: Secondary | ICD-10-CM

## 2022-12-21 DIAGNOSIS — Z0001 Encounter for general adult medical examination with abnormal findings: Secondary | ICD-10-CM | POA: Insufficient documentation

## 2022-12-21 DIAGNOSIS — K219 Gastro-esophageal reflux disease without esophagitis: Secondary | ICD-10-CM

## 2022-12-21 DIAGNOSIS — R0981 Nasal congestion: Secondary | ICD-10-CM | POA: Diagnosis not present

## 2022-12-21 LAB — COMPREHENSIVE METABOLIC PANEL
ALT: 16 U/L (ref 0–35)
AST: 17 U/L (ref 0–37)
Albumin: 4.8 g/dL (ref 3.5–5.2)
Alkaline Phosphatase: 41 U/L (ref 39–117)
BUN: 7 mg/dL (ref 6–23)
CO2: 28 mEq/L (ref 19–32)
Calcium: 9.6 mg/dL (ref 8.4–10.5)
Chloride: 103 mEq/L (ref 96–112)
Creatinine, Ser: 0.55 mg/dL (ref 0.40–1.20)
GFR: 122.77 mL/min (ref >60.00)
Glucose, Bld: 78 mg/dL (ref 70–99)
Potassium: 3.8 mEq/L (ref 3.5–5.1)
Sodium: 138 mEq/L (ref 135–145)
Total Bilirubin: 0.3 mg/dL (ref 0.2–1.2)
Total Protein: 7.8 g/dL (ref 6.0–8.3)

## 2022-12-21 LAB — HEMOGLOBIN A1C: Hgb A1c MFr Bld: 5.6 % (ref 4.6–6.5)

## 2022-12-21 LAB — CBC
HCT: 33.3 % — ABNORMAL LOW (ref 36.0–46.0)
Hemoglobin: 10.6 g/dL — ABNORMAL LOW (ref 12.0–15.0)
MCHC: 31.8 g/dL (ref 30.0–36.0)
MCV: 75.1 fl — ABNORMAL LOW (ref 78.0–100.0)
Platelets: 340 10*3/uL (ref 150.0–400.0)
RBC: 4.43 Mil/uL (ref 3.87–5.11)
RDW: 15.2 % (ref 11.5–15.5)
WBC: 6.7 10*3/uL (ref 4.0–10.5)

## 2022-12-21 LAB — POCT INFLUENZA A/B
Influenza A, POC: NEGATIVE
Influenza B, POC: NEGATIVE

## 2022-12-21 LAB — POC COVID19 BINAXNOW: SARS Coronavirus 2 Ag: NEGATIVE

## 2022-12-21 MED ORDER — OMEPRAZOLE 40 MG PO CPDR: 40.0000 mg | DELAYED_RELEASE_CAPSULE | Freq: Every day | ORAL | 1 refills | Status: DC

## 2022-12-21 NOTE — Assessment & Plan Note (Signed)
Will screen for diabetes and hep c. Encouraged patient to verify when she is due for next pap smear with her OBGYN, she is not sure if last pap was completed in 2020 or 2021.

## 2022-12-21 NOTE — Assessment & Plan Note (Signed)
Acute, POC COVID And FLU negative today.

## 2022-12-21 NOTE — Progress Notes (Signed)
New Patient Office Visit  Subjective    Patient ID: Anita Woods, female    DOB: 08/13/1991  Age: 32 y.o. MRN: [id number]  CC:  Chief Complaint  Patient presents with   New Patient (Initial Visit)    Patient states she is having some heartburn issues, wants a rx for that     HPI Anita Woods presents to establish care Has not been to PCP in 7 years. Turned 31 and wanted to establish care for overall checkup. Has long history of GERD, since childhood. Has been taking omeprazole chronically for years. Reports underwent upper endoscopy around 10 years ago. If she does not take omeprazole will have severe reflux. Had Gallbladder removed at age 45.  Reports some upper respiratory symptoms for the last 2 days. Was exposed to her significant other who had similar symptoms. Not coughing currently, mostly nasal congestion.  Has family history of diabetes, no knowledge of family history of early onset heart attack/stroke. Outpatient Encounter Medications as of 12/21/2022  Medication Sig   omeprazole (PRILOSEC) 40 MG capsule Take 1 capsule (40 mg total) by mouth daily.   [DISCONTINUED] Ascorbic Acid (VITAMIN C PO) Take by mouth.   [DISCONTINUED] Ergocalciferol (VITAMIN D2 PO) Take 1.25 mg by mouth every 30 (thirty) days. (Patient not taking: Reported on 01/02/2022)   [DISCONTINUED] Ferrous Sulfate (IRON PO) Take by mouth.   [DISCONTINUED] fluticasone (FLONASE) 50 MCG/ACT nasal spray Place 2 sprays into both nostrils 2 (two) times daily. (Patient not taking: Reported on 01/02/2022)   [DISCONTINUED] loratadine (CLARITIN) 10 MG tablet Take 10 mg by mouth daily. (Patient not taking: Reported on 01/02/2022)   [DISCONTINUED] medroxyPROGESTERone (PROVERA) 5 MG tablet Take 2 tablets (10 mg total) by mouth daily for 10 days. (Patient not taking: Reported on 01/02/2022)   [DISCONTINUED] montelukast (SINGULAIR) 10 MG tablet Take 10 mg by mouth at bedtime. (Patient not taking: Reported on 01/02/2022)    [DISCONTINUED] OMEPRAZOLE PO Take 20 mg by mouth.   [DISCONTINUED] pantoprazole (PROTONIX) 20 MG tablet Take 20 mg by mouth daily.   [DISCONTINUED] pantoprazole (PROTONIX) 40 MG tablet Take one tablet once daily as directed (Patient not taking: Reported on 01/02/2022)   [DISCONTINUED] ranitidine (ZANTAC) 300 MG tablet Take 1 tablet (300 mg total) by mouth at bedtime. (Patient not taking: Reported on 01/02/2022)   No facility-administered encounter medications on file as of 12/21/2022.    Past Medical History:  Diagnosis Date   GERD (gastroesophageal reflux disease)    Ovarian cyst    Recurrent upper respiratory infection (URI)     Past Surgical History:  Procedure Laterality Date   GALLBLADDER SURGERY  2011   TONSILLECTOMY  2001    Family History  Problem Relation Age of Onset   Diabetes Mother    High blood pressure Father    Migraines Paternal Aunt    Cancer Paternal Aunt    Diabetes Maternal Grandmother     Social History   Socioeconomic History   Marital status: Single    Spouse name: Not on file   Number of children: Not on file   Years of education: Not on file   Highest education level: Not on file  Occupational History   Not on file  Tobacco Use   Smoking status: Never   Smokeless tobacco: Never  Vaping Use   Vaping Use: Never used  Substance and Sexual Activity   Alcohol use: No   Drug use: No   Sexual activity: Yes    Birth control/protection:  Condom  Other Topics Concern   Not on file  Social History Narrative   Not on file   Social Determinants of Health   Financial Resource Strain: Not on file  Food Insecurity: Not on file  Transportation Needs: Not on file  Physical Activity: Not on file  Stress: Not on file  Social Connections: Not on file  Intimate Partner Violence: Not on file    Review of Systems  Constitutional:  Negative for fever, malaise/fatigue and weight loss.  Respiratory:  Negative for shortness of breath.   Cardiovascular:   Negative for chest pain and palpitations.  Gastrointestinal:  Negative for blood in stool.  Genitourinary:  Negative for hematuria.  Neurological:  Negative for seizures and loss of consciousness.  Psychiatric/Behavioral:  Negative for suicidal ideas.         Objective    BP 136/76   Pulse 100   Temp 98.1 F (36.7 C) (Oral)   Ht 5\' 8"  (1.727 m)   Wt 146 lb 8 oz (66.5 kg)   SpO2 99%   BMI 22.28 kg/m   Physical Exam Vitals reviewed.  Constitutional:      General: She is not in acute distress.    Appearance: Normal appearance.  HENT:     Head: Normocephalic and atraumatic.  Neck:     Vascular: No carotid bruit.  Cardiovascular:     Rate and Rhythm: Normal rate and regular rhythm.     Pulses: Normal pulses.     Heart sounds: Normal heart sounds.  Pulmonary:     Effort: Pulmonary effort is normal.     Breath sounds: Normal breath sounds.  Skin:    General: Skin is warm and dry.  Neurological:     General: No focal deficit present.     Mental Status: She is alert and oriented to person, place, and time.  Psychiatric:        Mood and Affect: Mood normal.        Behavior: Behavior normal.        Judgment: Judgment normal.         Assessment & Plan:   Problem List Items Addressed This Visit       Respiratory   Congestion of nasal sinus    Acute, POC COVID And FLU negative today.      Relevant Orders   POCT Influenza A/B   POC COVID-19     Digestive   Gastroesophageal reflux disease - Primary    Chronic, omeprazole 40mg /day sent to pharmacy. Refer to GI to determine if she would be a candidate for upper endoscopy. Discussed lifestyle/diet changes that can sometimes help with symptoms, hand out provided.       Relevant Medications   omeprazole (PRILOSEC) 40 MG capsule   Other Relevant Orders   Hemoglobin A1c   Comprehensive metabolic panel   CBC   Ambulatory referral to Gastroenterology     Other   Encounter for general adult medical examination  with abnormal findings    Will screen for diabetes and hep c. Encouraged patient to verify when she is due for next pap smear with her OBGYN, she is not sure if last pap was completed in 2020 or 2021.       Relevant Medications   omeprazole (PRILOSEC) 40 MG capsule   Other Relevant Orders   Hemoglobin A1c   Comprehensive metabolic panel   CBC   Ambulatory referral to Gastroenterology   Encounter for hepatitis C screening test for low  risk patient    Hep C antibody ordered, Further recommendations may be made based on these results.        Relevant Orders   Hepatitis C antibody   Return in about 1 month (around 01/19/2023) for CPE with Muaaz Brau - tdap.   Ailene Ards, NP

## 2022-12-21 NOTE — Assessment & Plan Note (Addendum)
Chronic, omeprazole 40mg /day sent to pharmacy. Refer to GI to determine if she would be a candidate for upper endoscopy. Discussed lifestyle/diet changes that can sometimes help with symptoms, hand out provided.

## 2022-12-21 NOTE — Assessment & Plan Note (Signed)
Hep C antibody ordered, Further recommendations may be made based on these results.

## 2022-12-23 LAB — HEPATITIS C ANTIBODY: Hepatitis C Ab: NONREACTIVE

## 2023-01-01 ENCOUNTER — Encounter: Payer: Self-pay | Admitting: Gastroenterology

## 2023-01-18 ENCOUNTER — Ambulatory Visit (INDEPENDENT_AMBULATORY_CARE_PROVIDER_SITE_OTHER): Payer: 59 | Admitting: Nurse Practitioner

## 2023-01-18 VITALS — BP 122/78 | HR 90 | Temp 97.8°F | Ht 68.0 in | Wt 147.4 lb

## 2023-01-18 DIAGNOSIS — D509 Iron deficiency anemia, unspecified: Secondary | ICD-10-CM | POA: Diagnosis not present

## 2023-01-18 LAB — CBC
HCT: 29.1 % — ABNORMAL LOW (ref 36.0–46.0)
Hemoglobin: 9.5 g/dL — ABNORMAL LOW (ref 12.0–15.0)
MCHC: 32.8 g/dL (ref 30.0–36.0)
MCV: 73.9 fl — ABNORMAL LOW (ref 78.0–100.0)
Platelets: 281 10*3/uL (ref 150.0–400.0)
RBC: 3.93 Mil/uL (ref 3.87–5.11)
RDW: 15.7 % — ABNORMAL HIGH (ref 11.5–15.5)
WBC: 7.8 10*3/uL (ref 4.0–10.5)

## 2023-01-18 LAB — VITAMIN B12: Vitamin B-12: 517 pg/mL (ref 211–911)

## 2023-01-18 LAB — FERRITIN: Ferritin: 6.2 ng/mL — ABNORMAL LOW (ref 10.0–291.0)

## 2023-01-18 LAB — FOLATE: Folate: 16.6 ng/mL (ref >5.9)

## 2023-01-18 NOTE — Progress Notes (Signed)
   Established Patient Office Visit  Subjective   Patient ID: Anita Woods, female    DOB: 1990-11-27  Age: 32 y.o. MRN: [id number]  Chief Complaint  Patient presents with   Anemia    Denies any history of anemia.  Labs show hemoglobin of 10.6 microcytic.  Does have intermittent fatigue sometimes has shortness of breath mostly with anxiety has had palpitations here and there and sometimes has dizziness.  Does not feel that she has a necessarily prolonged or heavy menstrual cycle.  Denies any visual blood in stool.    Review of Systems  Constitutional:  Positive for malaise/fatigue.  Respiratory:  Positive for shortness of breath (with anxiety).   Cardiovascular:  Positive for palpitations. Negative for chest pain.  Gastrointestinal:  Negative for blood in stool.  Genitourinary:        (-) negative for significant heavy menstrual cycle  Neurological:  Positive for dizziness.      Objective:     BP 122/78   Pulse 90   Temp 97.8 F (36.6 C) (Temporal)   Ht '5\' 8"'$  (1.727 m)   Wt 147 lb 6 oz (66.8 kg)   SpO2 99%   BMI 22.41 kg/m    Physical Exam Vitals reviewed.  Constitutional:      General: She is not in acute distress.    Appearance: Normal appearance.  HENT:     Head: Normocephalic and atraumatic.  Neck:     Vascular: No carotid bruit.  Cardiovascular:     Rate and Rhythm: Normal rate and regular rhythm.     Pulses: Normal pulses.     Heart sounds: Normal heart sounds.  Pulmonary:     Effort: Pulmonary effort is normal.     Breath sounds: Normal breath sounds.  Skin:    General: Skin is warm and dry.  Neurological:     General: No focal deficit present.     Mental Status: She is alert and oriented to person, place, and time.  Psychiatric:        Mood and Affect: Mood normal.        Behavior: Behavior normal.        Judgment: Judgment normal.      No results found for any visits on 01/18/23.    The ASCVD Risk score (Arnett DK, et al., 2019) failed  to calculate for the following reasons:   The 2019 ASCVD risk score is only valid for ages 59 to 70    Assessment & Plan:   Problem List Items Addressed This Visit       Other   Microcytic anemia - Primary    Anemia panel ordered today, if iron deficient will have her start on supplement and ask her to discuss this with GI in case the recommend colonoscopy.  If iron levels are normal we will consider checking for thalassemia.      Relevant Orders   Vitamin B12   Folate   Iron and TIBC   Ferritin   CBC    Return in about 6 weeks (around 03/01/2023) for F/U with Judson Roch.    Ailene Ards, NP

## 2023-01-18 NOTE — Assessment & Plan Note (Signed)
Anemia panel ordered today, if iron deficient will have her start on supplement and ask her to discuss this with GI in case the recommend colonoscopy.  If iron levels are normal we will consider checking for thalassemia.

## 2023-01-19 ENCOUNTER — Other Ambulatory Visit: Payer: Self-pay | Admitting: Nurse Practitioner

## 2023-01-19 DIAGNOSIS — D509 Iron deficiency anemia, unspecified: Secondary | ICD-10-CM

## 2023-01-19 LAB — IRON AND TIBC
Iron Saturation: 6 % — CL (ref 15–55)
Iron: 20 ug/dL — ABNORMAL LOW (ref 27–159)
Total Iron Binding Capacity: 362 ug/dL (ref 250–450)
UIBC: 342 ug/dL (ref 131–425)

## 2023-01-19 NOTE — Addendum Note (Signed)
Addended by: Ailene Ards on: 01/19/2023 05:24 PM   Modules accepted: Orders

## 2023-01-21 ENCOUNTER — Telehealth: Payer: Self-pay | Admitting: Oncology

## 2023-01-21 NOTE — Telephone Encounter (Signed)
Scheduled appt per 3/2 referral. Pt is aware of appt date and time. Pt is aware to arrive 15 mins prior to appt time and to bring and updated insurance card. Pt is aware of appt location.   ?

## 2023-01-23 ENCOUNTER — Other Ambulatory Visit (INDEPENDENT_AMBULATORY_CARE_PROVIDER_SITE_OTHER): Payer: 59

## 2023-01-23 ENCOUNTER — Telehealth: Payer: Self-pay | Admitting: Nurse Practitioner

## 2023-01-23 DIAGNOSIS — D509 Iron deficiency anemia, unspecified: Secondary | ICD-10-CM

## 2023-01-23 LAB — CBC WITH DIFFERENTIAL/PLATELET
Basophils Absolute: 0 10*3/uL (ref 0.0–0.1)
Basophils Relative: 0.6 % (ref 0.0–3.0)
Eosinophils Absolute: 0.1 10*3/uL (ref 0.0–0.7)
Eosinophils Relative: 1.7 % (ref 0.0–5.0)
HCT: 33.1 % — ABNORMAL LOW (ref 36.0–46.0)
Hemoglobin: 10.6 g/dL — ABNORMAL LOW (ref 12.0–15.0)
Lymphocytes Relative: 41 % (ref 12.0–46.0)
Lymphs Abs: 3.2 10*3/uL (ref 0.7–4.0)
MCHC: 32 g/dL (ref 30.0–36.0)
MCV: 73.9 fl — ABNORMAL LOW (ref 78.0–100.0)
Monocytes Absolute: 0.5 10*3/uL (ref 0.1–1.0)
Monocytes Relative: 6.6 % (ref 3.0–12.0)
Neutro Abs: 4 10*3/uL (ref 1.4–7.7)
Neutrophils Relative %: 50.1 % (ref 43.0–77.0)
Platelets: 302 10*3/uL (ref 150.0–400.0)
RBC: 4.49 Mil/uL (ref 3.87–5.11)
RDW: 15.4 % (ref 11.5–15.5)
WBC: 7.9 10*3/uL (ref 4.0–10.5)

## 2023-01-23 LAB — COMPREHENSIVE METABOLIC PANEL
ALT: 16 U/L (ref 0–35)
AST: 17 U/L (ref 0–37)
Albumin: 4.4 g/dL (ref 3.5–5.2)
Alkaline Phosphatase: 41 U/L (ref 39–117)
BUN: 15 mg/dL (ref 6–23)
CO2: 24 mEq/L (ref 19–32)
Calcium: 10 mg/dL (ref 8.4–10.5)
Chloride: 104 mEq/L (ref 96–112)
Creatinine, Ser: 0.65 mg/dL (ref 0.40–1.20)
GFR: 117.85 mL/min (ref >60.00)
Glucose, Bld: 86 mg/dL (ref 70–99)
Potassium: 4.3 mEq/L (ref 3.5–5.1)
Sodium: 138 mEq/L (ref 135–145)
Total Bilirubin: 0.2 mg/dL (ref 0.2–1.2)
Total Protein: 7.6 g/dL (ref 6.0–8.3)

## 2023-01-23 NOTE — Telephone Encounter (Signed)
Labs resulted right before I had to log off of my computer. Her blood counts have improved and she is not severely anemic. However she should seek evaluation at our office, urgent care, or the emergency department if she is feeling weak/dizzy. Please notify her of this.

## 2023-01-23 NOTE — Telephone Encounter (Signed)
Please call patient and let her know that I received a message from the lab regarding her having dizziness and weakness over the weekend.  Unfortunately her lab results have not yet come through.  I will be logging off of the computer shortly and will not be able to log back in until tomorrow morning.  If she is feeling significantly worse then she was about a week ago, she should proceed to the emergency department as this may be an indicator she is losing blood somewhere.  Please let me know if she has any questions.

## 2023-01-23 NOTE — Telephone Encounter (Signed)
Please call patient and notify her that the labs sent me a message regarding the patient having concerns of dizziness and weakness over the weekend.  Unfortunately, her lab results have not yet resulted and her symptoms may be a sign that she is losing blood and may be becoming more anemic.  I will not have access to her chart for the rest of the day. Thus, if she is feeling significantly worse than she was last week she should proceed to the emergency department for evaluation.

## 2023-01-24 LAB — PATHOLOGIST SMEAR REVIEW

## 2023-01-24 LAB — RETICULOCYTES
ABS Retic: 47520 cells/uL (ref 20000–80000)
Retic Ct Pct: 1.1 %

## 2023-01-24 LAB — LACTATE DEHYDROGENASE: LDH: 319 U/L — ABNORMAL HIGH (ref 100–200)

## 2023-01-24 LAB — HAPTOGLOBIN: Haptoglobin: 225 mg/dL — ABNORMAL HIGH (ref 43–212)

## 2023-01-24 LAB — SPECIMEN COMPROMISED

## 2023-01-24 NOTE — Telephone Encounter (Signed)
Made pt aware of lab results, still feeling weak, dizzy, pt stated it has not gotten better/ seem worse since she got her blood drawn. Pt said she has been taking iron pill this this weekend so she will continue and see if symptoms get better if not she will go to the urgent care or the ED

## 2023-01-29 NOTE — Telephone Encounter (Signed)
Spoke with pt and was able to inform her of Sarah's message. Pt states she understands. However, pt has concerns about the abnormal levels she sees via her MyChart of her labs. Pt would like to know what does those levels mean and if it is contributing to how she is feeling?  Pt state her [facility] lab notification was sent via MyChart stating specimen may be compromised. Pt is wanting to know what this means?  Pt states she is still feeling really rough. Pt is still having dizzy spells and does have some improvement but still feeling out of it.

## 2023-01-29 NOTE — Telephone Encounter (Signed)
Patient would like a call back about her results. Best callback is [phone].

## 2023-01-30 NOTE — Telephone Encounter (Signed)
Called pt gave her Anita Woods response. Pt states she feel a little better that last week. At times she still have dizziness, fatigue sxs. She states she is currently taking her iron pill and she is seeing the hematologist on 02/02/24. Will see how she feel but if symptoms get worse will go to ER. Pt have f/u w/ Anita Woods on 03/01/23.Marland KitchenJohny Chess

## 2023-02-02 ENCOUNTER — Inpatient Hospital Stay: Payer: 59

## 2023-02-02 ENCOUNTER — Encounter: Payer: Self-pay | Admitting: Hematology and Oncology

## 2023-02-02 ENCOUNTER — Inpatient Hospital Stay: Payer: 59 | Attending: Oncology | Admitting: Hematology and Oncology

## 2023-02-02 VITALS — BP 110/72 | HR 99 | Temp 98.8°F | Resp 19 | Ht 68.0 in | Wt 146.8 lb

## 2023-02-02 DIAGNOSIS — Z79899 Other long term (current) drug therapy: Secondary | ICD-10-CM

## 2023-02-02 DIAGNOSIS — D509 Iron deficiency anemia, unspecified: Secondary | ICD-10-CM

## 2023-02-02 LAB — CBC WITH DIFFERENTIAL/PLATELET
Abs Immature Granulocytes: 0.02 10*3/uL (ref 0.00–0.07)
Basophils Absolute: 0 10*3/uL (ref 0.0–0.1)
Basophils Relative: 1 %
Eosinophils Absolute: 0.1 10*3/uL (ref 0.0–0.5)
Eosinophils Relative: 2 %
HCT: 33.5 % — ABNORMAL LOW (ref 36.0–46.0)
Hemoglobin: 10.5 g/dL — ABNORMAL LOW (ref 12.0–15.0)
Immature Granulocytes: 0 %
Lymphocytes Relative: 35 %
Lymphs Abs: 2.8 10*3/uL (ref 0.7–4.0)
MCH: 24.4 pg — ABNORMAL LOW (ref 26.0–34.0)
MCHC: 31.3 g/dL (ref 30.0–36.0)
MCV: 77.9 fL — ABNORMAL LOW (ref 80.0–100.0)
Monocytes Absolute: 0.5 10*3/uL (ref 0.1–1.0)
Monocytes Relative: 6 %
Neutro Abs: 4.5 10*3/uL (ref 1.7–7.7)
Neutrophils Relative %: 56 %
Platelets: 297 10*3/uL (ref 150–400)
RBC: 4.3 MIL/uL (ref 3.87–5.11)
RDW: 17.3 % — ABNORMAL HIGH (ref 11.5–15.5)
WBC: 7.9 10*3/uL (ref 4.0–10.5)
nRBC: 0 % (ref 0.0–0.2)

## 2023-02-02 LAB — FERRITIN: Ferritin: 13 ng/mL (ref 11–307)

## 2023-02-02 LAB — IRON AND IRON BINDING CAPACITY (CC-WL,HP ONLY)
Iron: 44 ug/dL (ref 28–170)
Saturation Ratios: 11 % (ref 10.4–31.8)
TIBC: 386 ug/dL (ref 250–450)
UIBC: 342 ug/dL (ref 148–442)

## 2023-02-02 LAB — LACTATE DEHYDROGENASE: LDH: 129 U/L (ref 98–192)

## 2023-02-02 NOTE — Progress Notes (Signed)
[place] CONSULT NOTE  Patient Care Team: Ailene Ards, NP as PCP - General (Nurse Practitioner)  CHIEF COMPLAINTS/PURPOSE OF CONSULTATION:  IDA  ASSESSMENT & PLAN:   This is a very pleasant 32 year old young female patient with no significant past medical history except for history of cholecystectomy many years ago now diagnosed with iron deficiency anemia referred to hematology for the same.  She had physical recently which showed persistent and severe iron deficiency with mild anemia.  She denies any major complaints except for fatigue some shortness of breath on exertion, some huffing and puffing.  She used to exercise regularly but has not been able to do that given the fatigue.  She has noted that her menstrual cycles are regular and where whereas they were scant and irregular before.  She denies any other bleeding issues.  Physical examination, healthy appearing female patient with no major findings.  We have reviewed her labs from the past few weeks which shows microcytic hypochromic anemia with ferritin of 6 consistent with iron deficiency.  LDH likely abnormal because of older specimen.  [id number] and folic acid levels are normal.  Reticulocyte count has been normal.  Haptoglobin high which is not consistent with hemolysis.  She probably has also not been absorbing iron very well because of chronic antacid use. We have recommended that she continue oral iron supplementation once a day, repeat labs today and return to clinic in 8 weeks for follow-up.  She expressed understanding of all the recommendations.  Thank you for consulting [place] in the care of this patient.  Please do not hesitate to contact us with any additional questions or concerns.  HISTORY OF PRESENTING ILLNESS:  Anita Woods 32 y.o. female is here because of anemia.  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  Patient has been following up regularly with her PCP.  Recently she had some labs during her physical which showed microcytic hypochromic anemia, iron deficiency with ferritin of 6 and hence referred to hematology.  She was also started on oral iron supplementation ferrous sulfate 65/325 once a day.  She tried taking it twice a day but this caused severe constipation and hence transition to once a day.  She also takes an antacid on a regular basis.  She has not been eating much meat.  She has noted that her menstrual cycles have become more regular, in the past they were faint and irregular but now she has been having them regularly.  No other bleeding complaints such as hematochezia or melena.  No other family history of malignancies.  Rest of the pertinent 10 point ROS reviewed and negative   MEDICAL HISTORY:  Past Medical History:  Diagnosis Date   GERD (gastroesophageal reflux disease)    Ovarian cyst    Recurrent upper respiratory infection (URI)     SURGICAL HISTORY: Past Surgical History:  Procedure Laterality Date   GALLBLADDER SURGERY  2011   TONSILLECTOMY  2001    SOCIAL HISTORY: Social History   Socioeconomic History   Marital status: Single    Spouse name: Not on file   Number of children: Not on file   Years of education: Not on file   Highest education level: Not on file  Occupational History   Not on file  Tobacco Use   Smoking status: Never   Smokeless tobacco: Never  Vaping Use   Vaping Use:  Never used  Substance and Sexual Activity   Alcohol use: No   Drug use: No   Sexual activity: Yes    Birth control/protection: Condom  Other Topics Concern   Not on file  Social History Narrative   Not on file   Social Determinants of Health   Financial Resource Strain: Not on file  Food Insecurity: Not on file  Transportation Needs: Not on file  Physical Activity: Not on file  Stress: Not on file  Social Connections: Not on file   Intimate Partner Violence: Not on file    FAMILY HISTORY: Family History  Problem Relation Age of Onset   Diabetes Mother    High blood pressure Father    Migraines Paternal Aunt    Cancer Paternal Aunt    Diabetes Maternal Grandmother     ALLERGIES:  has No Known Allergies.  MEDICATIONS:  Current Outpatient Medications  Medication Sig Dispense Refill   BLACK ELDERBERRY,BERRY-FLOWER, PO Take 1,000 mg by mouth daily.     Cholecalciferol (VITAMIN D3 PO) Take by mouth.     Ergocalciferol (VITAMIN D2 PO) Take by mouth. OTC vitamin with D3     omeprazole (PRILOSEC) 40 MG capsule Take 1 capsule (40 mg total) by mouth daily. 90 capsule 1   No current facility-administered medications for this visit.     PHYSICAL EXAMINATION: ECOG PERFORMANCE STATUS: 0 - Asymptomatic  Vitals:   02/02/23 1006  BP: 110/72  Pulse: 99  Resp: 19  Temp: 98.8 F (37.1 C)  SpO2: 100%   Filed Weights   02/02/23 1006  Weight: 146 lb 12.8 oz (66.6 kg)    GENERAL:alert, no distress and comfortable SKIN: skin color, texture, turgor are normal, no rashes or significant lesions EYES: normal, conjunctiva are pink and non-injected, sclera clear OROPHARYNX:no exudate, no erythema and lips, buccal mucosa, and tongue normal  NECK: supple, thyroid normal size, non-tender, without nodularity LYMPH:  no palpable lymphadenopathy in the cervical, axillary LUNGS: clear to auscultation and percussion with normal breathing effort HEART: regular rate & rhythm and no murmurs and no lower extremity edema ABDOMEN:abdomen soft, non-tender and normal bowel sounds Musculoskeletal:no cyanosis of digits and no clubbing  PSYCH: alert & oriented x 3 with fluent speech NEURO: no focal motor/sensory deficits  LABORATORY DATA:  I have reviewed the data as listed Lab Results  Component Value Date   WBC 7.9 01/23/2023   HGB 10.6 (L) 01/23/2023   HCT 33.1 (L) 01/23/2023   MCV 73.9 (L) 01/23/2023   PLT 302.0 01/23/2023      Chemistry      Component Value Date/Time   NA 138 01/23/2023 0827   K 4.3 01/23/2023 0827   CL 104 01/23/2023 0827   CO2 24 01/23/2023 0827   BUN 15 01/23/2023 0827   CREATININE 0.65 01/23/2023 0827      Component Value Date/Time   CALCIUM 10.0 01/23/2023 0827   ALKPHOS 41 01/23/2023 0827   AST 17 01/23/2023 0827   ALT 16 01/23/2023 0827   BILITOT 0.2 01/23/2023 0827     I have reviewed pertinent labs which show severe iron deficiency.  I am not quite sure of the LDH is reliable since the specimen was thought to be sitting for a long time.  We will repeat CBC, iron panel, ferritin, LDH today.  RADIOGRAPHIC STUDIES: I have personally reviewed the radiological images as listed and agreed with the findings in the report. No results found.  All questions were answered. The patient knows  to call the clinic with any problems, questions or concerns. I spent 45 minutes in the care of this patient including H and P, review of records, counseling and coordination of care.     Benay Pike, MD 02/02/2023 10:09 AM

## 2023-02-07 ENCOUNTER — Encounter: Payer: Self-pay | Admitting: Gastroenterology

## 2023-02-07 ENCOUNTER — Ambulatory Visit (INDEPENDENT_AMBULATORY_CARE_PROVIDER_SITE_OTHER): Payer: 59 | Admitting: Gastroenterology

## 2023-02-07 VITALS — BP 128/72 | HR 57 | Ht 68.0 in | Wt 150.0 lb

## 2023-02-07 DIAGNOSIS — K219 Gastro-esophageal reflux disease without esophagitis: Secondary | ICD-10-CM | POA: Diagnosis not present

## 2023-02-07 DIAGNOSIS — D509 Iron deficiency anemia, unspecified: Secondary | ICD-10-CM

## 2023-02-07 NOTE — Patient Instructions (Signed)
You have been scheduled for an endoscopy. Please follow written instructions given to you at your visit today. If you use inhalers (even only as needed), please bring them with you on the day of your procedure.  _______________________________________________________  If your blood pressure at your visit was 140/90 or greater, please contact your primary care physician to follow up on this.  _______________________________________________________  If you are age 47 or older, your body mass index should be between 23-30. Your Body mass index is 22.81 kg/m. If this is out of the aforementioned range listed, please consider follow up with your Primary Care Provider.  If you are age 16 or younger, your body mass index should be between 19-25. Your Body mass index is 22.81 kg/m. If this is out of the aformentioned range listed, please consider follow up with your Primary Care Provider.   ________________________________________________________  The [facility] providers would like to encourage you to use [facility] to communicate with providers for non-urgent requests or questions.  Due to long hold times on the telephone, sending your provider a message by [facility] may be a faster and more efficient way to get a response.  Please allow 48 business hours for a response.  Please remember that this is for non-urgent requests.  _______________________________________________________

## 2023-02-07 NOTE — Progress Notes (Signed)
HPI : Anita Woods is a very pleasant 32 year old female with no significant medical history who is referred to [place] by Jeralyn Ruths, NP for further evaluation and management of chronic GERD symptoms.  She states that she has had problems with GERD since she was a child.   She describes typical GERD symptoms of heartburn and acid regurgitation, as well as occasional hoarseness.  She denies globus, throat irritation or cough.  No nausea or vomiting.  No dysphagia.  She has been taking omeprazole since she was in high school.  If she misses a dose, her symptoms will make her miserable.  Even with taking daily omeprazole she still have symptoms of excessive belching, bloating/distention and regurgitation; rarely has heartburn with daily omeprazole, unless she eats/drinks certain things such as orange juice or fatty/greasy foods.  She will have nighttime symptoms if she eats a late dinner. Prior to starting omeprazole, she had taken pantoprazole or TUMs.  She was recently diagnosed with iron deficiency, and started on PO iron a few weeks ago with subsequent improvement in her iron stores.  She did report symptoms of fatigue and some episodes of dizziness. She denies any prior history of iron deficiency and she denies any history of heavy menses. Her weight is stable. She denies any chronic lower GI symptoms such as abdominal pain, constipation, diarrhea or blood in the stool.  She has regular bowel movements with formed brown stools daily.  Her mother is also bothered by GERD, but she denies any family history of GI malignancy, IBD or celiac disease.  She underwent an EGD in [place] in 2011.  She believes this was normal.  Past Medical History:  Diagnosis Date   GERD (gastroesophageal reflux disease)    Ovarian cyst    Recurrent upper respiratory infection (URI)      Past Surgical History:  Procedure Laterality Date   GALLBLADDER SURGERY  2011   TONSILLECTOMY  2001   Family History   Problem Relation Age of Onset   Diabetes Mother    High blood pressure Father    Migraines Paternal Aunt    Cancer Paternal Aunt    Diabetes Maternal Grandmother    Social History   Tobacco Use   Smoking status: Never   Smokeless tobacco: Never  Vaping Use   Vaping Use: Never used  Substance Use Topics   Alcohol use: No   Drug use: No   Current Outpatient Medications  Medication Sig Dispense Refill   AMBULATORY NON FORMULARY MEDICATION Medication Name: OTC Vitamin D and Vitamin b12 one tablet once daily     BLACK ELDERBERRY,BERRY-FLOWER, PO Take 1,000 mg by mouth daily.     ferrous sulfate 325 (65 FE) MG tablet Take 325 mg by mouth daily with breakfast.     omeprazole (PRILOSEC) 40 MG capsule Take 1 capsule (40 mg total) by mouth daily. 90 capsule 1   No current facility-administered medications for this visit.   No Known Allergies   Review of Systems: All systems reviewed and negative except where noted in HPI.    No results found.  Physical Exam: BP 128/72   Pulse (!) 57   Ht 5\' 8"  (1.727 m)   Wt 150 lb (68 kg)   BMI 22.81 kg/m  Constitutional: Pleasant,well-developed, Caucasian female in no acute distress. HEENT: Normocephalic and atraumatic. Conjunctivae are normal. No scleral icterus. Neck supple.  Cardiovascular: Normal rate, regular rhythm.  Pulmonary/chest: Effort normal and breath sounds normal. No wheezing, rales or  rhonchi. Abdominal: Soft, nondistended, nontender. Bowel sounds active throughout. There are no masses palpable. No hepatomegaly. Extremities: no edema Neurological: Alert and oriented to person place and time. Skin: Skin is warm and dry. No rashes noted. Psychiatric: Normal mood and affect. Behavior is normal.  CBC    Component Value Date/Time   WBC 7.9 02/02/2023 1034   RBC 4.30 02/02/2023 1034   HGB 10.5 (L) 02/02/2023 1034   HGB 13.7 02/07/2017 1535   HCT 33.5 (L) 02/02/2023 1034   HCT 41.0 02/07/2017 1535   PLT 297 02/02/2023  1034   PLT 309 02/07/2017 1535   MCV 77.9 (L) 02/02/2023 1034   MCV 86 02/07/2017 1535   MCH 24.4 (L) 02/02/2023 1034   MCHC 31.3 02/02/2023 1034   RDW 17.3 (H) 02/02/2023 1034   RDW 13.5 02/07/2017 1535   LYMPHSABS 2.8 02/02/2023 1034   LYMPHSABS 3.0 02/07/2017 1535   MONOABS 0.5 02/02/2023 1034   EOSABS 0.1 02/02/2023 1034   EOSABS 0.1 02/07/2017 1535   BASOSABS 0.0 02/02/2023 1034   BASOSABS 0.0 02/07/2017 1535    CMP     Component Value Date/Time   NA 138 01/23/2023 0827   K 4.3 01/23/2023 0827   CL 104 01/23/2023 0827   CO2 24 01/23/2023 0827   GLUCOSE 86 01/23/2023 0827   BUN 15 01/23/2023 0827   CREATININE 0.65 01/23/2023 0827   CALCIUM 10.0 01/23/2023 0827   PROT 7.6 01/23/2023 0827   ALBUMIN 4.4 01/23/2023 0827   AST 17 01/23/2023 0827   ALT 16 01/23/2023 0827   ALKPHOS 41 01/23/2023 0827   BILITOT 0.2 01/23/2023 0827   GFRNONAA >60 12/28/2021 1115   Component Ref Range & Units 5 d ago 2 wk ago  Iron 28 - 170 ug/dL 44 20 Low  R  TIBC 250 - 450 ug/dL 386 362  Saturation Ratios 10.4 - 31.8 % 11   UIBC 148 - 442 ug/dL 342 342 R   Component Ref Range & Units 5 d ago 2 wk ago  Ferritin 11 - 307 ng/mL 13 6.2 Low  R   Component Ref Range & Units 5 d ago (02/02/23) 2 wk ago (01/23/23) 2 wk ago (01/18/23) 1 mo ago (12/21/22) 1 yr ago (12/28/21) 6 yr ago (02/07/17)  WBC 4.0 - 10.5 K/uL 7.9 7.9 7.8 6.7 9.5 8.2 R  RBC 3.87 - 5.11 MIL/uL 4.30 4.49 R 3.93 R 4.43 R 4.32 4.75 R  Hemoglobin 12.0 - 15.0 g/dL 10.5 Low  10.6 Low  9.5 Low  10.6 Low  11.6 Low  13.7 R  Comment: Reticulocyte Hemoglobin testing may be clinically indicated, consider ordering this additional test [id number]  HCT 36.0 - 46.0 % 33.5 Low  33.1 Low  29.1 Low  33.3 Low  36.6 41.0 R  MCV 80.0 - 100.0 fL 77.9 Low  73.9 Low  R 73.9 Low  R 75.1 Low  R 84.7 86 R  MCH 26.0 - 34.0 pg 24.4 Low     26.9 28.8 R  MCHC 30.0 - 36.0 g/dL 31.3 32.0 32.8 31.8 31.7 33.4 R  RDW 11.5 - 15.5 % 17.3 High  15.4  15.7 High  15.2 13.0 13.5 R  Platelets 150 - 400 K/uL 297 302.0 R 281.0 R 340.0 R 276 309 R     ASSESSMENT AND PLAN:  32 year old female with long standing GERD symptoms incompletely controlled with once daily omeprazole.  She also has recently diagnosed iron deficiency that seems unlikely to  be secondary to menstrual blood loss based on her lack of history of iron deficiency and reported light menses.  She has no overt GI bleeding.  The likelihood of iron deficiency from a mass lesion in the colon is extremely low. I suspect her iron deficiency may be related to her chronic PPI use.  An EGD is warranted to evaluate for complications of GERD and to assess for causes of iron deficiency.   We discussed the role of fundoplication in treatment of GERD, and the patient is interested in pursuing this.   GERD - EGD to evaluate anatomy and for objective evidence of reflux prior to potential fundoplication (TIF vs surgery) - Continue once daily omeprazole for now  Iron def anemia, possibly secondary to chronic PPI use - EGD with gastric and duodenal biopsies - No need for colonoscopy at this time - Continue oral iron supplements  The details, risks (including bleeding, perforation, infection, missed lesions, medication reactions and possible hospitalization or surgery if complications occur), benefits, and alternatives to EGD with possible biopsy and possible dilation were discussed with the patient and she consents to proceed.   Neamiah Sciarra E. Candis Schatz, [facility]  Ailene Ards, NP

## 2023-03-01 ENCOUNTER — Ambulatory Visit: Payer: 59 | Admitting: Nurse Practitioner

## 2023-03-28 ENCOUNTER — Encounter: Payer: 59 | Admitting: Gastroenterology

## 2023-03-29 ENCOUNTER — Inpatient Hospital Stay: Payer: 59

## 2023-03-29 ENCOUNTER — Inpatient Hospital Stay: Payer: 59 | Attending: Oncology | Admitting: Hematology and Oncology

## 2023-03-29 DIAGNOSIS — D509 Iron deficiency anemia, unspecified: Secondary | ICD-10-CM | POA: Insufficient documentation

## 2023-03-29 DIAGNOSIS — Z79899 Other long term (current) drug therapy: Secondary | ICD-10-CM | POA: Diagnosis not present

## 2023-03-29 LAB — IRON AND IRON BINDING CAPACITY (CC-WL,HP ONLY)
Iron: 65 ug/dL (ref 28–170)
Saturation Ratios: 19 % (ref 10.4–31.8)
TIBC: 349 ug/dL (ref 250–450)
UIBC: 284 ug/dL (ref 148–442)

## 2023-03-29 LAB — CBC WITH DIFFERENTIAL/PLATELET
Abs Immature Granulocytes: 0.01 10*3/uL (ref 0.00–0.07)
Basophils Absolute: 0.1 10*3/uL (ref 0.0–0.1)
Basophils Relative: 1 %
Eosinophils Absolute: 0.1 10*3/uL (ref 0.0–0.5)
Eosinophils Relative: 2 %
HCT: 38.2 % (ref 36.0–46.0)
Hemoglobin: 12.2 g/dL (ref 12.0–15.0)
Immature Granulocytes: 0 %
Lymphocytes Relative: 45 %
Lymphs Abs: 2.6 10*3/uL (ref 0.7–4.0)
MCH: 26.7 pg (ref 26.0–34.0)
MCHC: 31.9 g/dL (ref 30.0–36.0)
MCV: 83.6 fL (ref 80.0–100.0)
Monocytes Absolute: 0.4 10*3/uL (ref 0.1–1.0)
Monocytes Relative: 7 %
Neutro Abs: 2.5 10*3/uL (ref 1.7–7.7)
Neutrophils Relative %: 45 %
Platelets: 251 10*3/uL (ref 150–400)
RBC: 4.57 MIL/uL (ref 3.87–5.11)
RDW: 17.1 % — ABNORMAL HIGH (ref 11.5–15.5)
WBC: 5.7 10*3/uL (ref 4.0–10.5)
nRBC: 0 % (ref 0.0–0.2)

## 2023-03-29 LAB — FERRITIN: Ferritin: 12 ng/mL (ref 11–307)

## 2023-03-29 NOTE — Progress Notes (Signed)
[facility] CONSULT NOTE  Patient Care Team: Elenore Paddy, NP as PCP - General (Nurse Practitioner)  CHIEF COMPLAINTS/PURPOSE OF CONSULTATION:  IDA  ASSESSMENT & PLAN:   This is a very pleasant 32 year old young female patient with no significant past medical history except for history of cholecystectomy many years ago now diagnosed with iron deficiency anemia referred to hematology for the same.  She had physical recently which showed persistent and severe iron deficiency with mild anemia. She is now on oral iron supplementation and tolerating it well if its once a day. No concerns on exam. Repeat labs today.  She can continue oral iron in the interim Encouraged her to talk to her therapist about the ongoing stressors. She expressed understanding of all the recommendations.  Thank you for consulting [place] in the care of this patient.  Please do not hesitate to contact us with any additional questions or concerns.  HISTORY OF PRESENTING ILLNESS:  Anita Woods 32 y.o. female is here because of anemia.  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  She was also started on oral iron supplementation ferrous sulfate 65/325 once a day.  She tried taking it twice a day but this caused severe constipation and hence transition to once a day.  She is here for follow up on this. She has been feeling better, energy is better, able to walk without getting winded. No ice craving. Menstruation is not usually heavy.  No other health complaints. Rest of the pertinent 10 point ROS reviewed and neg.  MEDICAL HISTORY:  Past Medical History:  Diagnosis Date   GERD (gastroesophageal reflux disease)    Ovarian cyst    Recurrent upper respiratory infection (URI)     SURGICAL HISTORY: Past Surgical History:  Procedure Laterality Date   GALLBLADDER SURGERY  2011   TONSILLECTOMY   2001    SOCIAL HISTORY: Social History   Socioeconomic History   Marital status: Single    Spouse name: Not on file   Number of children: Not on file   Years of education: Not on file   Highest education level: Not on file  Occupational History   Not on file  Tobacco Use   Smoking status: Never   Smokeless tobacco: Never  Vaping Use   Vaping Use: Never used  Substance and Sexual Activity   Alcohol use: No   Drug use: No   Sexual activity: Yes    Birth control/protection: Condom  Other Topics Concern   Not on file  Social History Narrative   Not on file   Social Determinants of Health   Financial Resource Strain: Not on file  Food Insecurity: Not on file  Transportation Needs: Not on file  Physical Activity: Not on file  Stress: Not on file  Social Connections: Not on file  Intimate Partner Violence: Not on file    FAMILY HISTORY: Family History  Problem Relation Age of Onset   Diabetes Mother    High blood pressure Father    Migraines Paternal Aunt    Cancer Paternal Aunt    Diabetes Maternal Grandmother     ALLERGIES:  has No Known Allergies.  MEDICATIONS:  Current Outpatient Medications  Medication Sig Dispense Refill   AMBULATORY NON FORMULARY MEDICATION Medication Name: OTC Vitamin D and Vitamin b12 one tablet once daily     BLACK ELDERBERRY,BERRY-FLOWER, PO Take 1,000 mg by mouth daily.  ferrous sulfate 325 (65 FE) MG tablet Take 325 mg by mouth daily with breakfast.     omeprazole (PRILOSEC) 40 MG capsule Take 1 capsule (40 mg total) by mouth daily. 90 capsule 1   No current facility-administered medications for this visit.     PHYSICAL EXAMINATION: ECOG PERFORMANCE STATUS: 0 - Asymptomatic  There were no vitals filed for this visit.  There were no vitals filed for this visit.   GENERAL:alert, no distress and comfortable SKIN: skin color, texture, turgor are normal, no rashes or significant lesions EYES: normal, conjunctiva are pink  and non-injected, sclera clear OROPHARYNX:no exudate, no erythema and lips, buccal mucosa, and tongue normal  NECK: supple, thyroid normal size, non-tender, without nodularity LYMPH:  no palpable lymphadenopathy in the cervical, axillary LUNGS: clear to auscultation and percussion with normal breathing effort HEART: regular rate & rhythm and no murmurs and no lower extremity edema ABDOMEN:abdomen soft, non-tender and normal bowel sounds Musculoskeletal:no cyanosis of digits and no clubbing  PSYCH: alert & oriented x 3 with fluent speech NEURO: no focal motor/sensory deficits  LABORATORY DATA:  I have reviewed the data as listed Lab Results  Component Value Date   WBC 7.9 02/02/2023   HGB 10.5 (L) 02/02/2023   HCT 33.5 (L) 02/02/2023   MCV 77.9 (L) 02/02/2023   PLT 297 02/02/2023     Chemistry      Component Value Date/Time   NA 138 01/23/2023 0827   K 4.3 01/23/2023 0827   CL 104 01/23/2023 0827   CO2 24 01/23/2023 0827   BUN 15 01/23/2023 0827   CREATININE 0.65 01/23/2023 0827      Component Value Date/Time   CALCIUM 10.0 01/23/2023 0827   ALKPHOS 41 01/23/2023 0827   AST 17 01/23/2023 0827   ALT 16 01/23/2023 0827   BILITOT 0.2 01/23/2023 0827       RADIOGRAPHIC STUDIES: I have personally reviewed the radiological images as listed and agreed with the findings in the report. No results found.  All questions were answered. The patient knows to call the clinic with any problems, questions or concerns. I spent 15  minutes in the care of this patient including H and P, review of records, counseling and coordination of care.     Rachel Moulds, MD 03/29/2023 8:45 AM

## 2023-05-03 ENCOUNTER — Encounter: Payer: Self-pay | Admitting: Gastroenterology

## 2023-05-03 ENCOUNTER — Ambulatory Visit (AMBULATORY_SURGERY_CENTER): Payer: 59 | Admitting: Gastroenterology

## 2023-05-03 VITALS — BP 128/78 | HR 84 | Temp 98.0°F | Resp 12 | Ht 68.0 in | Wt 150.0 lb

## 2023-05-03 DIAGNOSIS — Z0001 Encounter for general adult medical examination with abnormal findings: Secondary | ICD-10-CM

## 2023-05-03 DIAGNOSIS — D131 Benign neoplasm of stomach: Secondary | ICD-10-CM

## 2023-05-03 DIAGNOSIS — K317 Polyp of stomach and duodenum: Secondary | ICD-10-CM

## 2023-05-03 DIAGNOSIS — D500 Iron deficiency anemia secondary to blood loss (chronic): Secondary | ICD-10-CM

## 2023-05-03 DIAGNOSIS — K219 Gastro-esophageal reflux disease without esophagitis: Secondary | ICD-10-CM

## 2023-05-03 MED ORDER — OMEPRAZOLE 40 MG PO CPDR
40.0000 mg | DELAYED_RELEASE_CAPSULE | Freq: Two times a day (BID) | ORAL | 3 refills | Status: DC
Start: 2023-05-03 — End: 2023-12-03

## 2023-05-03 MED ORDER — SODIUM CHLORIDE 0.9 % IV SOLN
500.0000 mL | INTRAVENOUS | Status: DC
Start: 2023-05-03 — End: 2023-05-03

## 2023-05-03 NOTE — Patient Instructions (Signed)
Thank you for coming in to see [place] today! Resume your diet today. Increase Omeprazole to 40 mg twice per day for 6 weeks as the polyp removal sites heal.  Then reduce to once daily. Repeat blood work in 4 months to check blood counts and iron panel. Return to regular daily activities tomorrow.   YOU HAD AN ENDOSCOPIC PROCEDURE TODAY AT THE [facility]:   Refer to the procedure report that was given to you for any specific questions about what was found during the examination.  If the procedure report does not answer your questions, please call your gastroenterologist to clarify.  If you requested that your care partner not be given the details of your procedure findings, then the procedure report has been included in a sealed envelope for you to review at your convenience later.  YOU SHOULD EXPECT: Some feelings of bloating in the abdomen. Passage of more gas than usual.  Walking can help get rid of the air that was put into your GI tract during the procedure and reduce the bloating. If you had a lower endoscopy (such as a colonoscopy or flexible sigmoidoscopy) you may notice spotting of blood in your stool or on the toilet paper. If you underwent a bowel prep for your procedure, you may not have a normal bowel movement for a few days.  Please Note:  You might notice some irritation and congestion in your nose or some drainage.  This is from the oxygen used during your procedure.  There is no need for concern and it should clear up in a day or so.  SYMPTOMS TO REPORT IMMEDIATELY:  Following upper endoscopy (EGD)  Vomiting of blood or coffee ground material  New chest pain or pain under the shoulder blades  Painful or persistently difficult swallowing  New shortness of breath  Fever of 100F or higher  Black, tarry-looking stools  For urgent or emergent issues, a gastroenterologist can be reached at any hour by calling [phone]. Do not use MyChart messaging for urgent  concerns.    DIET:  We do recommend a small meal at first, but then you may proceed to your regular diet.  Drink plenty of fluids but you should avoid alcoholic beverages for 24 hours.  ACTIVITY:  You should plan to take it easy for the rest of today and you should NOT DRIVE or use heavy machinery until tomorrow (because of the sedation medicines used during the test).    FOLLOW UP: Our staff will call the number listed on your records the next business day following your procedure.  We will call around 7:15- 8:00 am to check on you and address any questions or concerns that you may have regarding the information given to you following your procedure. If we do not reach you, we will leave a message.     If any biopsies were taken you will be contacted by phone or by letter within the next 1-3 weeks.  Please call us at [phone] if you have not heard about the biopsies in 3 weeks.    SIGNATURES/CONFIDENTIALITY: You and/or your care partner have signed paperwork which will be entered into your electronic medical record.  These signatures attest to the fact that that the information above on your After Visit Summary has been reviewed and is understood.  Full responsibility of the confidentiality of this discharge information lies with you and/or your care-partner.

## 2023-05-03 NOTE — Progress Notes (Signed)
Called to room to assist during endoscopic procedure.  Patient ID and intended procedure confirmed with present staff. Received instructions for my participation in the procedure from the performing physician.  

## 2023-05-03 NOTE — Op Note (Signed)
[facility] Patient Name: Anita Woods Procedure Date: 05/03/2023 10:01 AM MRN: [id number] Endoscopist: Lorin Picket E. Tomasa Rand , MD, [id number] Age: 32 Referring MD:  Date of Birth: 1991/06/21 Gender: Female Account #: [id number] Procedure:                Upper GI endoscopy Indications:              Iron deficiency anemia, Esophageal reflux symptoms                            that persist despite appropriate therapy Medicines:                Monitored Anesthesia Care Procedure:                Pre-Anesthesia Assessment:                           - Prior to the procedure, a History and Physical                            was performed, and patient medications and                            allergies were reviewed. The patient's tolerance of                            previous anesthesia was also reviewed. The risks                            and benefits of the procedure and the sedation                            options and risks were discussed with the patient.                            All questions were answered, and informed consent                            was obtained. Prior Anticoagulants: The patient has                            taken no anticoagulant or antiplatelet agents. ASA                            Grade Assessment: II - A patient with mild systemic                            disease. After reviewing the risks and benefits,                            the patient was deemed in satisfactory condition to                            undergo the procedure.  After obtaining informed consent, the endoscope was                            passed under direct vision. Throughout the                            procedure, the patient's blood pressure, pulse, and                            oxygen saturations were monitored continuously. The                            GIF [id number] #[id number] was introduced through the                            mouth,  and advanced to the second part of duodenum.                            The upper GI endoscopy was accomplished without                            difficulty. The patient tolerated the procedure                            well. Scope In: Scope Out: Findings:                 The examined portions of the nasopharynx,                            oropharynx and larynx were normal.                           The examined esophagus was normal.                           A 2 cm hiatal hernia was present.                           Multiple 3 to 15 mm pedunculated and sessile polyps                            with no bleeding but with stigmata of recent                            bleeding (adherent hematin) were found in the                            gastric body. Ten of these polyps were removed with                            a hot snare. Resection and retrieval were complete.                            Estimated blood  loss was minimal.                           Two 3 mm sessile polyps with no stigmata of recent                            bleeding were found in the gastric antrum. The                            polyp was removed with a cold snare. Resection and                            retrieval were complete. Estimated blood loss was                            minimal.                           Normal mucosa was found in the entire examined                            stomach. Biopsies were taken with a cold forceps                            for Helicobacter pylori testing. Estimated blood                            loss was minimal.                           The examined duodenum was normal. Biopsies for                            histology were taken with a cold forceps for                            evaluation of celiac disease. Estimated blood loss                            was minimal. Complications:            No immediate complications. Estimated Blood Loss:     Estimated blood loss  was minimal. Impression:               - The examined portions of the nasopharynx,                            oropharynx and larynx were normal.                           - Normal esophagus.                           - 2 cm hiatal hernia.                           -  Multiple gastric polyps. Resected and retrieved.                            These were consistent with fundic gland polyps.                            Given the size of some of the polyps and the                            adherent hematin, this is the most likely source of                            the patient's iron deficiency anemia.                           - Two gastric polyps. Resected and retrieved.                           - Normal mucosa was found in the entire stomach.                            Biopsied.                           - Normal examined duodenum. Biopsied. Recommendation:           - Patient has a contact number available for                            emergencies. The signs and symptoms of potential                            delayed complications were discussed with the                            patient. Return to normal activities tomorrow.                            Written discharge instructions were provided to the                            patient.                           - Resume previous diet.                           - Continue present medications. Increase omeprazole                            to twice daily for 6 weeks while the polypectomy                            sites heal.                           -  Await pathology results.                           - Repeat CBC and iron panel in 4 months. Jessey Stehlin E. Tomasa Rand, MD 05/03/2023 10:48:42 AM This report has been signed electronically.

## 2023-05-03 NOTE — Progress Notes (Signed)
[facility] History and Physical   Primary Care Physician:  Elenore Paddy, NP   Reason for Procedure:   GERD, iron deficiency anemia  Plan:    EGD     HPI: Anita Woods is a 32 y.o. female undergoing EGD to evaluate chronic GERD symptoms and iron deficiency anemia.  Her GERD symptoms are partially controlled with once daily omeprazole.  She denies any overt GI bleeding.    Past Medical History:  Diagnosis Date   Anemia    GERD (gastroesophageal reflux disease)    Ovarian cyst    Recurrent upper respiratory infection (URI)     Past Surgical History:  Procedure Laterality Date   GALLBLADDER SURGERY  2011   TONSILLECTOMY  2001   WISDOM TOOTH EXTRACTION Bilateral 04/19/2009    Prior to Admission medications   Medication Sig Start Date End Date Taking? Authorizing Provider  AMBULATORY NON FORMULARY MEDICATION Medication Name: OTC Vitamin D and Vitamin b12 one tablet once daily   Yes [provider]  ferrous sulfate 325 (65 FE) MG tablet Take 325 mg by mouth daily with breakfast.   Yes [provider]  omeprazole (PRILOSEC) 40 MG capsule Take 1 capsule (40 mg total) by mouth daily. 12/21/22  Yes Elenore Paddy, NP    Current Outpatient Medications  Medication Sig Dispense Refill   AMBULATORY NON FORMULARY MEDICATION Medication Name: OTC Vitamin D and Vitamin b12 one tablet once daily     ferrous sulfate 325 (65 FE) MG tablet Take 325 mg by mouth daily with breakfast.     omeprazole (PRILOSEC) 40 MG capsule Take 1 capsule (40 mg total) by mouth daily. 90 capsule 1   Current Facility-Administered Medications  Medication Dose Route Frequency Provider Last Rate Last Admin   0.9 %  sodium chloride infusion  500 mL Intravenous Continuous Jenel Lucks, MD        Allergies as of 05/03/2023   (No Known Allergies)    Family History  Problem Relation Age of Onset   Diabetes Mother    High blood pressure Father    Migraines Paternal Aunt     Cancer Paternal Aunt    Diabetes Maternal Grandmother    Colon cancer Neg Hx    Esophageal cancer Neg Hx    Stomach cancer Neg Hx    Rectal cancer Neg Hx     Social History   Socioeconomic History   Marital status: Single    Spouse name: Not on file   Number of children: Not on file   Years of education: Not on file   Highest education level: Not on file  Occupational History   Not on file  Tobacco Use   Smoking status: Never   Smokeless tobacco: Never  Vaping Use   Vaping Use: Never used  Substance and Sexual Activity   Alcohol use: No    Comment: socially   Drug use: No   Sexual activity: Yes    Birth control/protection: Condom  Other Topics Concern   Not on file  Social History Narrative   Not on file   Social Determinants of Health   Financial Resource Strain: Not on file  Food Insecurity: Not on file  Transportation Needs: Not on file  Physical Activity: Not on file  Stress: Not on file  Social Connections: Not on file  Intimate Partner Violence: Not on file    Review of Systems:  All other review of systems negative except as mentioned in the HPI.  Physical Exam: Vital signs BP 123/74   Pulse 85   Temp 98 F (36.7 C) (Skin)   Ht 5\' 8"  (1.727 m)   Wt 150 lb (68 kg)   SpO2 100%   BMI 22.81 kg/m   General:   Alert,  Well-developed, well-nourished, pleasant and cooperative in NAD Airway:  Mallampati 1 Lungs:  Clear throughout to auscultation.   Heart:  Regular rate and rhythm; no murmurs, clicks, rubs,  or gallops. Abdomen:  Soft, nontender and nondistended. Normal bowel sounds.   Neuro/Psych:  Normal mood and affect. A and O x 3   Anita Woods E. Tomasa Rand, MD [facility]

## 2023-05-03 NOTE — Progress Notes (Signed)
Patient states there have been no changes to medical or surgical history since time of pre-visit. 

## 2023-05-03 NOTE — Progress Notes (Signed)
Pt in recovery with monitors in place, VSS. Report given to receiving RN. Bite guard was placed with pt awake to ensure comfort. No dental or soft tissue damage noted. 

## 2023-05-06 ENCOUNTER — Other Ambulatory Visit: Payer: Self-pay

## 2023-05-06 ENCOUNTER — Telehealth: Payer: Self-pay | Admitting: *Deleted

## 2023-05-06 DIAGNOSIS — D509 Iron deficiency anemia, unspecified: Secondary | ICD-10-CM

## 2023-05-06 NOTE — Telephone Encounter (Signed)
  Follow up Call-     05/03/2023    9:37 AM  Call back number  Post procedure Call Back phone  # [phone]  Permission to leave phone message Yes     Patient questions:  Do you have a fever, pain , or abdominal swelling? No. Pain Score  0 *  Have you tolerated food without any problems? Yes.    Have you been able to return to your normal activities? Yes.    Do you have any questions about your discharge instructions: Diet   No. Medications  No. Follow up visit  No.  Do you have questions or concerns about your Care? No.  Actions: * If pain score is 4 or above: No action needed, pain <4.

## 2023-05-08 NOTE — Progress Notes (Signed)
Anita Woods,  The polyps resected from your stomach were benign fundic gland polyps.  There was no evidence of infection with Helicobacter pylori or intestinal metaplasia/dysplasia.  These types of polyps are typically secondary to acid suppression therapy and no specific follow-up required for these small, benign polyps.  In your case, I suspect they have been the source of your iron deficiency anemia.  Ten of the largest polyps were removed.  Smaller polyps are very unlikely to cause any chronic blood loss. The smaller polyps resected were reactive polyps with no precancerous changes.  No further evaluation is needed. The biopsies of your stomach and duodenum were normal, with no evidence of celiac disease or H. Pylori infection.  Please take the omeprazole twice daily for 6 weeks and then plan to repeat a CBC and iron panel in 4 months.  Please let me know if you would like to discuss the TIF procedure further.

## 2023-07-10 ENCOUNTER — Ambulatory Visit (INDEPENDENT_AMBULATORY_CARE_PROVIDER_SITE_OTHER): Payer: 59 | Admitting: Family Medicine

## 2023-07-10 VITALS — Temp 97.7°F | Wt 156.8 lb

## 2023-07-10 DIAGNOSIS — I951 Orthostatic hypotension: Secondary | ICD-10-CM

## 2023-07-10 DIAGNOSIS — R42 Dizziness and giddiness: Secondary | ICD-10-CM | POA: Diagnosis not present

## 2023-07-10 DIAGNOSIS — R0602 Shortness of breath: Secondary | ICD-10-CM

## 2023-07-10 DIAGNOSIS — D500 Iron deficiency anemia secondary to blood loss (chronic): Secondary | ICD-10-CM

## 2023-07-10 LAB — POC COVID19 BINAXNOW: SARS Coronavirus 2 Ag: NEGATIVE

## 2023-07-10 NOTE — Patient Instructions (Addendum)
Please go to the ED if develop symptoms of severe bleeding including in stool or through vomiting.  We are checking your blood counts and iron levels.  Please follow-up with your PCP and GI regarding ongoing anemia

## 2023-07-10 NOTE — Progress Notes (Unsigned)
Assessment/Plan:   Problem List Items Addressed This Visit   None   There are no discontinued medications.  No follow-ups on file.    Subjective:   Encounter date: 07/10/2023  Anita Woods is a 32 y.o. female who has Gastroesophageal reflux disease; Encounter for general adult medical examination with abnormal findings; Encounter for hepatitis C screening test for low risk patient; Congestion of nasal sinus; and Microcytic anemia on their problem list..   She  has a past medical history of Anemia, GERD (gastroesophageal reflux disease), Ovarian cyst, and Recurrent upper respiratory infection (URI)..   She presents with chief complaint of Fatigue (Been feeling weak, cold, can't walk straight x 2 days. Has low iron. ) .   HPI:   ROS  Past Surgical History:  Procedure Laterality Date   GALLBLADDER SURGERY  2011   TONSILLECTOMY  2001   WISDOM TOOTH EXTRACTION Bilateral 04/19/2009    Outpatient Medications Prior to Visit  Medication Sig Dispense Refill   AMBULATORY NON FORMULARY MEDICATION Medication Name: OTC Vitamin D and Vitamin b12 one tablet once daily     ferrous sulfate 325 (65 FE) MG tablet Take 325 mg by mouth daily with breakfast.     omeprazole (PRILOSEC) 40 MG capsule Take 1 capsule (40 mg total) by mouth in the morning and at bedtime. 90 capsule 3   No facility-administered medications prior to visit.    Family History  Problem Relation Age of Onset   Diabetes Mother    High blood pressure Father    Migraines Paternal Aunt    Cancer Paternal Aunt    Diabetes Maternal Grandmother    Colon cancer Neg Hx    Esophageal cancer Neg Hx    Stomach cancer Neg Hx    Rectal cancer Neg Hx     Social History   Socioeconomic History   Marital status: Single    Spouse name: Not on file   Number of children: Not on file   Years of education: Not on file   Highest education level: Bachelor's degree (e.g., BA, AB, BS)  Occupational History   Not on file   Tobacco Use   Smoking status: Never   Smokeless tobacco: Never  Vaping Use   Vaping status: Never Used  Substance and Sexual Activity   Alcohol use: No    Comment: socially   Drug use: No   Sexual activity: Yes    Birth control/protection: Condom  Other Topics Concern   Not on file  Social History Narrative   Not on file   Social Determinants of Health   Financial Resource Strain: Low Risk  (07/10/2023)   Overall Financial Resource Strain (CARDIA)    Difficulty of Paying Living Expenses: Not hard at all  Food Insecurity: No Food Insecurity (07/10/2023)   Hunger Vital Sign    Worried About Running Out of Food in the Last Year: Never true    Ran Out of Food in the Last Year: Never true  Transportation Needs: No Transportation Needs (07/10/2023)   PRAPARE - Administrator, Civil Service (Medical): No    Lack of Transportation (Non-Medical): No  Physical Activity: Insufficiently Active (07/10/2023)   Exercise Vital Sign    Days of Exercise per Week: 2 days    Minutes of Exercise per Session: 40 min  Stress: Stress Concern Present (07/10/2023)   [employer] of Occupational Health - Occupational Stress Questionnaire    Feeling of Stress : To some extent  Social  Connections: Unknown (07/10/2023)   Social Connection and Isolation Panel [NHANES]    Frequency of Communication with Friends and Family: Once a week    Frequency of Social Gatherings with Friends and Family: Patient declined    Attends Religious Services: Patient declined    Database administrator or Organizations: No    Attends Engineer, structural: Not on file    Marital Status: Living with partner  Intimate Partner Violence: Not on file                                                                                                  Objective:  Physical Exam: Temp 97.7 F (36.5 C) (Temporal)   Wt 156 lb 12.8 oz (71.1 kg)   LMP 06/09/2023   SpO2 98%   BMI 23.84 kg/m    Orthostatic  VS for the past 72 hrs (Last 3 readings):  Orthostatic BP Patient Position BP Location Cuff Size Orthostatic Pulse  07/10/23 1335 114/76 Sitting Left Arm Large 82  07/10/23 1332 118/76 Standing Left Arm Large 91  07/10/23 1329 102/70 Sitting Left Arm Large 92     Physical Exam  No results found.  No results found for this or any previous visit (from the past 2160 hour(s)).      Garner Nash, MD, MS

## 2023-07-11 DIAGNOSIS — R42 Dizziness and giddiness: Secondary | ICD-10-CM | POA: Insufficient documentation

## 2023-07-11 LAB — CBC WITH DIFFERENTIAL/PLATELET
Basophils Absolute: 0.1 10*3/uL (ref 0.0–0.1)
Basophils Relative: 1.2 % (ref 0.0–3.0)
Eosinophils Absolute: 0.1 10*3/uL (ref 0.0–0.7)
Eosinophils Relative: 1.5 % (ref 0.0–5.0)
HCT: 38.8 % (ref 36.0–46.0)
Hemoglobin: 12.7 g/dL (ref 12.0–15.0)
Lymphocytes Relative: 31.6 % (ref 12.0–46.0)
Lymphs Abs: 2.7 10*3/uL (ref 0.7–4.0)
MCHC: 32.8 g/dL (ref 30.0–36.0)
MCV: 87.7 fl (ref 78.0–100.0)
Monocytes Absolute: 0.6 10*3/uL (ref 0.1–1.0)
Monocytes Relative: 7.4 % (ref 3.0–12.0)
Neutro Abs: 5 10*3/uL (ref 1.4–7.7)
Neutrophils Relative %: 58.3 % (ref 43.0–77.0)
Platelets: 256 10*3/uL (ref 150.0–400.0)
RBC: 4.42 Mil/uL (ref 3.87–5.11)
RDW: 13.8 % (ref 11.5–15.5)
WBC: 8.5 10*3/uL (ref 4.0–10.5)

## 2023-07-11 LAB — BASIC METABOLIC PANEL
BUN: 12 mg/dL (ref 6–23)
CO2: 26 meq/L (ref 19–32)
Calcium: 9.3 mg/dL (ref 8.4–10.5)
Chloride: 101 meq/L (ref 96–112)
Creatinine, Ser: 0.64 mg/dL (ref 0.40–1.20)
GFR: 117.91 mL/min (ref >60.00)
Glucose, Bld: 93 mg/dL (ref 70–99)
Potassium: 4 meq/L (ref 3.5–5.1)
Sodium: 137 meq/L (ref 135–145)

## 2023-07-11 LAB — IRON AND TIBC
Iron Saturation: 25 % (ref 15–55)
Iron: 80 ug/dL (ref 27–159)
Total Iron Binding Capacity: 318 ug/dL (ref 250–450)
UIBC: 238 ug/dL (ref 131–425)

## 2023-07-11 NOTE — Assessment & Plan Note (Signed)
Suspected recurrence of anemia given patient history and symptoms. Recent symptoms include fatigue, dizziness, shortness of breath, weakness, and blurry vision. Differential Diagnosis:  Anemia due to insufficient iron intake or absorption. Potential GI bleeding. Viral illness, including COVID-19.  Plan:  Order CBC to assess current blood counts. Order iron studies to evaluate iron levels. Monitor symptoms and reassess if the condition worsens. Advising patient to go to ED if signs of severe anemia or significant GI bleed (e.g., melena, hematochezia). COVID-19 test is negative; viral etiology less likely. No indication for flu testing at this time per patient's preference. Follow up with gastroenterology if GI bleeding is suspected or if anemia recurs.

## 2023-07-12 LAB — FOLATE: Folate: 10.2 ng/mL (ref >5.9)

## 2023-07-12 LAB — VITAMIN B12: Vitamin B-12: 552 pg/mL (ref 211–911)

## 2023-07-12 LAB — FERRITIN: Ferritin: 13.2 ng/mL (ref 10.0–291.0)

## 2023-08-20 ENCOUNTER — Telehealth: Payer: Self-pay | Admitting: Hematology and Oncology

## 2023-08-20 NOTE — Telephone Encounter (Signed)
Left patient a message in reagrds to rescheduled appointment due to provider being out of office; left callback if needed for rescheduling

## 2023-09-09 ENCOUNTER — Telehealth: Payer: Self-pay

## 2023-09-09 NOTE — Telephone Encounter (Signed)
-----   Message from [facility] sent at 05/06/2023  9:52 AM EDT ----- Remind patient to return for CBC and iron panel

## 2023-09-09 NOTE — Telephone Encounter (Signed)
Left message for patient to return to our office on the basment level to have 4 month lab work drawn per Dr Tomasa Rand.

## 2023-09-11 NOTE — Telephone Encounter (Signed)
Spoke with patient, states she is having a hematology appointment in 11/15, states she is having the same lab work done and will wait until that appointment.

## 2023-09-30 ENCOUNTER — Other Ambulatory Visit: Payer: 59

## 2023-09-30 ENCOUNTER — Ambulatory Visit: Payer: 59 | Admitting: Hematology and Oncology

## 2023-10-04 ENCOUNTER — Inpatient Hospital Stay: Payer: 59

## 2023-10-04 ENCOUNTER — Inpatient Hospital Stay (HOSPITAL_BASED_OUTPATIENT_CLINIC_OR_DEPARTMENT_OTHER): Payer: 59 | Admitting: Hematology and Oncology

## 2023-10-04 ENCOUNTER — Inpatient Hospital Stay: Payer: 59 | Attending: Hematology and Oncology

## 2023-10-04 VITALS — BP 115/67 | HR 102 | Temp 97.9°F | Resp 16 | Wt 165.4 lb

## 2023-10-04 DIAGNOSIS — D509 Iron deficiency anemia, unspecified: Secondary | ICD-10-CM

## 2023-10-04 DIAGNOSIS — Z79899 Other long term (current) drug therapy: Secondary | ICD-10-CM | POA: Diagnosis not present

## 2023-10-04 LAB — IRON AND IRON BINDING CAPACITY (CC-WL,HP ONLY)
Iron: 38 ug/dL (ref 28–170)
Saturation Ratios: 10 % — ABNORMAL LOW (ref 10.4–31.8)
TIBC: 400 ug/dL (ref 250–450)
UIBC: 362 ug/dL (ref 148–442)

## 2023-10-04 LAB — CBC WITH DIFFERENTIAL/PLATELET
Abs Immature Granulocytes: 0.02 10*3/uL (ref 0.00–0.07)
Basophils Absolute: 0.1 10*3/uL (ref 0.0–0.1)
Basophils Relative: 1 %
Eosinophils Absolute: 0.5 10*3/uL (ref 0.0–0.5)
Eosinophils Relative: 6 %
HCT: 36.5 % (ref 36.0–46.0)
Hemoglobin: 11.9 g/dL — ABNORMAL LOW (ref 12.0–15.0)
Immature Granulocytes: 0 %
Lymphocytes Relative: 34 %
Lymphs Abs: 2.8 10*3/uL (ref 0.7–4.0)
MCH: 28.3 pg (ref 26.0–34.0)
MCHC: 32.6 g/dL (ref 30.0–36.0)
MCV: 86.9 fL (ref 80.0–100.0)
Monocytes Absolute: 0.6 10*3/uL (ref 0.1–1.0)
Monocytes Relative: 8 %
Neutro Abs: 4.4 10*3/uL (ref 1.7–7.7)
Neutrophils Relative %: 51 %
Platelets: 267 10*3/uL (ref 150–400)
RBC: 4.2 MIL/uL (ref 3.87–5.11)
RDW: 12.4 % (ref 11.5–15.5)
WBC: 8.4 10*3/uL (ref 4.0–10.5)
nRBC: 0 % (ref 0.0–0.2)

## 2023-10-04 LAB — FERRITIN: Ferritin: 11 ng/mL (ref 11–307)

## 2023-10-04 NOTE — Progress Notes (Signed)
[facility] CONSULT NOTE  Patient Care Team: Elenore Paddy, NP as PCP - General (Nurse Practitioner)  CHIEF COMPLAINTS/PURPOSE OF CONSULTATION:  IDA  ASSESSMENT & PLAN:   This is a very pleasant 32 year old young female patient with no significant past medical history except for history of cholecystectomy many years ago now diagnosed with iron deficiency anemia referred to hematology for the same.    She was taking oral iron supplementation for approximately 6 to 7 months and then stopped back in September because she was not able to tolerate it, she had constant gut issues and constipation and bleeding per rectum because of severe constipation.  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  Overall she has at least a couple reasons to contribute to iron deficiency anemia which are her menstruation as well as a GI bleed.  She has taken oral iron for 7 months but no significant improvement in ferritin.  Will overall consider IV iron since she is unable to tolerate oral iron and there has not been much improvement with oral iron.  She is agreeable to IV iron.  There are several formularies of intravenous iron available in the market.  We have discussed about risk of allergic/infusion reactions including potentially life-threatening anaphylaxis with intravenous iron however these serious allergic reactions are exceedingly rare and overestimated.  In contrast to serious allergic reactions, IV iron may be associated with nonallergic infusion reactions including self-limited urticaria, palpitations, dizziness, neck and back spasm which again occur in less than 1% of the individuals and do not progress to more serious reactions.  She will return to clinic for labs and follow-up in 4 months.   Iron panel and ferritin to be added today.  She expressed understanding of all  the recommendations.  Thank you for consulting [place] in the care of this patient.  Please do not hesitate to contact us with any additional questions or concerns.  HISTORY OF PRESENTING ILLNESS:  Anita Woods 32 y.o. female is here because of anemia.  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    She has had an endoscopy since last visit, she had gastric polyps, which were recently bleeding contributing to IDA according to the patient and gastroenterology team. She stopped iron in September she says, constipation was becoming an issue. All in all, she took oral iron for about 6/7 months. She says she feels better overall. She has been consuming more iron rich foods.  She denies any shortness of breath, pica.  She has regular menstrual cycles which are unpredictable, some months they are heavy and other months they are not heavy. Rest of the pertinent 10 point ROS reviewed and neg.  MEDICAL HISTORY:  Past Medical History:  Diagnosis Date   Anemia    GERD (gastroesophageal reflux disease)    Ovarian cyst    Recurrent upper respiratory infection (URI)     SURGICAL HISTORY: Past Surgical History:  Procedure Laterality Date   GALLBLADDER SURGERY  2011   TONSILLECTOMY  2001   WISDOM TOOTH EXTRACTION Bilateral 04/19/2009    SOCIAL HISTORY: Social History   Socioeconomic History   Marital status: Single    Spouse name: Not on file   Number of children: Not on file   Years of education: Not on file  Highest education level: Bachelor's degree (e.g., BA, AB, BS)  Occupational History   Not on file  Tobacco Use   Smoking status: Never   Smokeless tobacco: Never  Vaping Use   Vaping status: Never Used  Substance and Sexual Activity   Alcohol use: No    Comment: socially   Drug use: No   Sexual activity: Yes    Birth control/protection: Condom  Other Topics  Concern   Not on file  Social History Narrative   Not on file   Social Determinants of Health   Financial Resource Strain: Low Risk  (07/10/2023)   Overall Financial Resource Strain (CARDIA)    Difficulty of Paying Living Expenses: Not hard at all  Food Insecurity: No Food Insecurity (07/10/2023)   Hunger Vital Sign    Worried About Running Out of Food in the Last Year: Never true    Ran Out of Food in the Last Year: Never true  Transportation Needs: No Transportation Needs (07/10/2023)   PRAPARE - Administrator, Civil Service (Medical): No    Lack of Transportation (Non-Medical): No  Physical Activity: Insufficiently Active (07/10/2023)   Exercise Vital Sign    Days of Exercise per Week: 2 days    Minutes of Exercise per Session: 40 min  Stress: Stress Concern Present (07/10/2023)   [employer] of Occupational Health - Occupational Stress Questionnaire    Feeling of Stress : To some extent  Social Connections: Unknown (07/10/2023)   Social Connection and Isolation Panel [NHANES]    Frequency of Communication with Friends and Family: Once a week    Frequency of Social Gatherings with Friends and Family: Patient declined    Attends Religious Services: Patient declined    Database administrator or Organizations: No    Attends Engineer, structural: Not on file    Marital Status: Living with partner  Intimate Partner Violence: Not on file    FAMILY HISTORY: Family History  Problem Relation Age of Onset   Diabetes Mother    High blood pressure Father    Migraines Paternal Aunt    Cancer Paternal Aunt    Diabetes Maternal Grandmother    Colon cancer Neg Hx    Esophageal cancer Neg Hx    Stomach cancer Neg Hx    Rectal cancer Neg Hx     ALLERGIES:  has No Known Allergies.  MEDICATIONS:  Current Outpatient Medications  Medication Sig Dispense Refill   AMBULATORY NON FORMULARY MEDICATION Medication Name: OTC Vitamin D and Vitamin b12 one tablet once  daily     ferrous sulfate 325 (65 FE) MG tablet Take 325 mg by mouth daily with breakfast.     omeprazole (PRILOSEC) 40 MG capsule Take 1 capsule (40 mg total) by mouth in the morning and at bedtime. 90 capsule 3   No current facility-administered medications for this visit.     PHYSICAL EXAMINATION: ECOG PERFORMANCE STATUS: 0 - Asymptomatic  Vitals:   10/04/23 0820  BP: 115/67  Pulse: (!) 102  Resp: 16  Temp: 97.9 F (36.6 C)  SpO2: 100%    Filed Weights   10/04/23 0820  Weight: 165 lb 6.4 oz (75 kg)     GENERAL:alert, no distress and comfortable Rest of the physical exam deferred in lieu of counseling  LABORATORY DATA:  I have reviewed the data as liste20d Lab Results  Component Value Date   WBC 8.4 10/04/2023   HGB 11.9 (L) 10/04/2023   HCT  36.5 10/04/2023   MCV 86.9 10/04/2023   PLT 267 10/04/2023     Chemistry      Component Value Date/Time   NA 137 07/10/2023 1416   K 4.0 07/10/2023 1416   CL 101 07/10/2023 1416   CO2 26 07/10/2023 1416   BUN 12 07/10/2023 1416   CREATININE 0.64 07/10/2023 1416      Component Value Date/Time   CALCIUM 9.3 07/10/2023 1416   ALKPHOS 41 01/23/2023 0827   AST 17 01/23/2023 0827   ALT 16 01/23/2023 0827   BILITOT 0.2 01/23/2023 0827       RADIOGRAPHIC STUDIES: I have personally reviewed the radiological images as listed and agreed with the findings in the report. No results found.  All questions were answered. The patient knows to call the clinic with any problems, questions or concerns. I spent 30 minutes in the care of this patient including H and P, review of records, counseling and coordination of care.     Rachel Moulds, MD 10/04/2023 8:30 AM

## 2023-10-09 ENCOUNTER — Inpatient Hospital Stay: Payer: 59

## 2023-10-09 VITALS — BP 115/73 | HR 88 | Temp 98.3°F | Resp 16

## 2023-10-09 DIAGNOSIS — D509 Iron deficiency anemia, unspecified: Secondary | ICD-10-CM | POA: Diagnosis not present

## 2023-10-09 MED ORDER — SODIUM CHLORIDE 0.9% FLUSH
10.0000 mL | Freq: Two times a day (BID) | INTRAVENOUS | Status: DC
  Administered 2023-10-09: 10 mL via INTRAVENOUS

## 2023-10-09 MED ORDER — IRON SUCROSE 20 MG/ML IV SOLN
200.0000 mg | Freq: Once | INTRAVENOUS | Status: AC
  Administered 2023-10-09: 200 mg via INTRAVENOUS
  Filled 2023-10-09: qty 10

## 2023-10-09 NOTE — Progress Notes (Signed)
Patient tolerated first IV push venofer well. Post 30-min observation period vitals WNL and patient denies any headache, dizziness, or flushing. PIV removed intact and patient ambulated out of clinic unassisted without incident.  Vitals:   10/09/23 0803 10/09/23 0915  BP: 117/76 115/73  Pulse: 92 88  Resp: 17 16  Temp: 98.3 F (36.8 C)   SpO2: 100% 99%

## 2023-10-09 NOTE — Patient Instructions (Signed)
Iron Sucrose Injection What is this medication? IRON SUCROSE (EYE ern SOO krose) treats low levels of iron (iron deficiency anemia) in people with kidney disease. Iron is a mineral that plays an important role in making red blood cells, which carry oxygen from your lungs to the rest of your body. This medicine may be used for other purposes; ask your health care provider or pharmacist if you have questions. COMMON BRAND NAME(S): Venofer What should I tell my care team before I take this medication? They need to know if you have any of these conditions: Anemia not caused by low iron levels Heart disease High levels of iron in the blood Kidney disease Liver disease An unusual or allergic reaction to iron, other medications, foods, dyes, or preservatives Pregnant or trying to get pregnant Breastfeeding How should I use this medication? This medication is for infusion into a vein. It is given in a hospital or clinic setting. Talk to your care team about the use of this medication in children. While this medication may be prescribed for children as young as 2 years for selected conditions, precautions do apply. Overdosage: If you think you have taken too much of this medicine contact a poison control center or emergency room at once. NOTE: This medicine is only for you. Do not share this medicine with others. What if I miss a dose? Keep appointments for follow-up doses. It is important not to miss your dose. Call your care team if you are unable to keep an appointment. What may interact with this medication? Do not take this medication with any of the following: Deferoxamine Dimercaprol Other iron products This medication may also interact with the following: Chloramphenicol Deferasirox This list may not describe all possible interactions. Give your health care provider a list of all the medicines, herbs, non-prescription drugs, or dietary supplements you use. Also tell them if you smoke,  drink alcohol, or use illegal drugs. Some items may interact with your medicine. What should I watch for while using this medication? Visit your care team regularly. Tell your care team if your symptoms do not start to get better or if they get worse. You may need blood work done while you are taking this medication. You may need to follow a special diet. Talk to your care team. Foods that contain iron include: whole grains/cereals, dried fruits, beans, or peas, leafy green vegetables, and organ meats (liver, kidney). What side effects may I notice from receiving this medication? Side effects that you should report to your care team as soon as possible: Allergic reactions--skin rash, itching, hives, swelling of the face, lips, tongue, or throat Low blood pressure--dizziness, feeling faint or lightheaded, blurry vision Shortness of breath Side effects that usually do not require medical attention (report to your care team if they continue or are bothersome): Flushing Headache Joint pain Muscle pain Nausea Pain, redness, or irritation at injection site This list may not describe all possible side effects. Call your doctor for medical advice about side effects. You may report side effects to FDA at 1-800-FDA-1088. Where should I keep my medication? This medication is given in a hospital or clinic. It will not be stored at home. NOTE: This sheet is a summary. It may not cover all possible information. If you have questions about this medicine, talk to your doctor, pharmacist, or health care provider.  2024 Elsevier/Gold Standard (2023-04-12 00:00:00)

## 2023-10-16 ENCOUNTER — Inpatient Hospital Stay: Payer: 59

## 2023-10-16 ENCOUNTER — Other Ambulatory Visit: Payer: Self-pay | Admitting: *Deleted

## 2023-10-16 VITALS — BP 113/73 | HR 91 | Temp 98.5°F | Resp 16

## 2023-10-16 DIAGNOSIS — D509 Iron deficiency anemia, unspecified: Secondary | ICD-10-CM | POA: Diagnosis not present

## 2023-10-16 MED ORDER — METHYLPREDNISOLONE SODIUM SUCC 125 MG IJ SOLR: 125.0000 mg | Freq: Once | INTRAMUSCULAR | Status: DC | PRN

## 2023-10-16 MED ORDER — DIPHENHYDRAMINE HCL 50 MG/ML IJ SOLN: 50.0000 mg | Freq: Once | INTRAMUSCULAR | Status: DC | PRN

## 2023-10-16 MED ORDER — EPINEPHRINE 0.3 MG/0.3ML IJ SOAJ: 0.3000 mg | Freq: Once | INTRAMUSCULAR | Status: DC | PRN

## 2023-10-16 MED ORDER — SODIUM CHLORIDE 0.9% FLUSH
10.0000 mL | Freq: Two times a day (BID) | INTRAVENOUS | Status: DC
Start: 2023-10-16 — End: 2023-10-16

## 2023-10-16 MED ORDER — IRON SUCROSE 20 MG/ML IV SOLN
200.0000 mg | Freq: Once | INTRAVENOUS | Status: AC
  Administered 2023-10-16: 200 mg via INTRAVENOUS
  Filled 2023-10-16: qty 10

## 2023-10-16 MED ORDER — ALBUTEROL SULFATE HFA 108 (90 BASE) MCG/ACT IN AERS: 2.0000 | INHALATION_SPRAY | Freq: Once | RESPIRATORY_TRACT | Status: DC | PRN

## 2023-10-16 MED ORDER — FAMOTIDINE IN NACL 20-0.9 MG/50ML-% IV SOLN: 20.0000 mg | Freq: Once | INTRAVENOUS | Status: DC | PRN

## 2023-10-16 MED ORDER — SODIUM CHLORIDE 0.9% FLUSH: 10.0000 mL | Freq: Two times a day (BID) | INTRAVENOUS | Status: DC

## 2023-10-16 NOTE — Progress Notes (Signed)
IV started with NS to infuse at [facility] & iron given IVP slow & tol. Well.  VSS & d/c to home after 30 " obs.

## 2023-10-16 NOTE — Patient Instructions (Signed)
Iron Sucrose Injection What is this medication? IRON SUCROSE (EYE ern SOO krose) treats low levels of iron (iron deficiency anemia) in people with kidney disease. Iron is a mineral that plays an important role in making red blood cells, which carry oxygen from your lungs to the rest of your body. This medicine may be used for other purposes; ask your health care provider or pharmacist if you have questions. COMMON BRAND NAME(S): Venofer What should I tell my care team before I take this medication? They need to know if you have any of these conditions: Anemia not caused by low iron levels Heart disease High levels of iron in the blood Kidney disease Liver disease An unusual or allergic reaction to iron, other medications, foods, dyes, or preservatives Pregnant or trying to get pregnant Breastfeeding How should I use this medication? This medication is for infusion into a vein. It is given in a hospital or clinic setting. Talk to your care team about the use of this medication in children. While this medication may be prescribed for children as young as 2 years for selected conditions, precautions do apply. Overdosage: If you think you have taken too much of this medicine contact a poison control center or emergency room at once. NOTE: This medicine is only for you. Do not share this medicine with others. What if I miss a dose? Keep appointments for follow-up doses. It is important not to miss your dose. Call your care team if you are unable to keep an appointment. What may interact with this medication? Do not take this medication with any of the following: Deferoxamine Dimercaprol Other iron products This medication may also interact with the following: Chloramphenicol Deferasirox This list may not describe all possible interactions. Give your health care provider a list of all the medicines, herbs, non-prescription drugs, or dietary supplements you use. Also tell them if you smoke,  drink alcohol, or use illegal drugs. Some items may interact with your medicine. What should I watch for while using this medication? Visit your care team regularly. Tell your care team if your symptoms do not start to get better or if they get worse. You may need blood work done while you are taking this medication. You may need to follow a special diet. Talk to your care team. Foods that contain iron include: whole grains/cereals, dried fruits, beans, or peas, leafy green vegetables, and organ meats (liver, kidney). What side effects may I notice from receiving this medication? Side effects that you should report to your care team as soon as possible: Allergic reactions--skin rash, itching, hives, swelling of the face, lips, tongue, or throat Low blood pressure--dizziness, feeling faint or lightheaded, blurry vision Shortness of breath Side effects that usually do not require medical attention (report to your care team if they continue or are bothersome): Flushing Headache Joint pain Muscle pain Nausea Pain, redness, or irritation at injection site This list may not describe all possible side effects. Call your doctor for medical advice about side effects. You may report side effects to FDA at 1-800-FDA-1088. Where should I keep my medication? This medication is given in a hospital or clinic. It will not be stored at home. NOTE: This sheet is a summary. It may not cover all possible information. If you have questions about this medicine, talk to your doctor, pharmacist, or health care provider.  2024 Elsevier/Gold Standard (2023-04-12 00:00:00)

## 2023-10-18 ENCOUNTER — Telehealth: Payer: Self-pay | Admitting: Hematology and Oncology

## 2023-10-23 ENCOUNTER — Inpatient Hospital Stay: Payer: 59 | Attending: Hematology and Oncology

## 2023-10-23 VITALS — BP 119/66 | HR 84 | Temp 98.3°F | Resp 14

## 2023-10-23 DIAGNOSIS — D509 Iron deficiency anemia, unspecified: Secondary | ICD-10-CM | POA: Diagnosis present

## 2023-10-23 MED ORDER — IRON SUCROSE 20 MG/ML IV SOLN
200.0000 mg | Freq: Once | INTRAVENOUS | Status: AC
  Administered 2023-10-23: 200 mg via INTRAVENOUS
  Filled 2023-10-23: qty 10

## 2023-10-23 MED ORDER — SODIUM CHLORIDE 0.9% FLUSH
10.0000 mL | Freq: Two times a day (BID) | INTRAVENOUS | Status: DC
  Administered 2023-10-23: 10 mL via INTRAVENOUS

## 2023-10-23 NOTE — Patient Instructions (Signed)
Iron Sucrose Injection What is this medication? IRON SUCROSE (EYE ern SOO krose) treats low levels of iron (iron deficiency anemia) in people with kidney disease. Iron is a mineral that plays an important role in making red blood cells, which carry oxygen from your lungs to the rest of your body. This medicine may be used for other purposes; ask your health care provider or pharmacist if you have questions. COMMON BRAND NAME(S): Venofer What should I tell my care team before I take this medication? They need to know if you have any of these conditions: Anemia not caused by low iron levels Heart disease High levels of iron in the blood Kidney disease Liver disease An unusual or allergic reaction to iron, other medications, foods, dyes, or preservatives Pregnant or trying to get pregnant Breastfeeding How should I use this medication? This medication is for infusion into a vein. It is given in a hospital or clinic setting. Talk to your care team about the use of this medication in children. While this medication may be prescribed for children as young as 2 years for selected conditions, precautions do apply. Overdosage: If you think you have taken too much of this medicine contact a poison control center or emergency room at once. NOTE: This medicine is only for you. Do not share this medicine with others. What if I miss a dose? Keep appointments for follow-up doses. It is important not to miss your dose. Call your care team if you are unable to keep an appointment. What may interact with this medication? Do not take this medication with any of the following: Deferoxamine Dimercaprol Other iron products This medication may also interact with the following: Chloramphenicol Deferasirox This list may not describe all possible interactions. Give your health care provider a list of all the medicines, herbs, non-prescription drugs, or dietary supplements you use. Also tell them if you smoke,  drink alcohol, or use illegal drugs. Some items may interact with your medicine. What should I watch for while using this medication? Visit your care team regularly. Tell your care team if your symptoms do not start to get better or if they get worse. You may need blood work done while you are taking this medication. You may need to follow a special diet. Talk to your care team. Foods that contain iron include: whole grains/cereals, dried fruits, beans, or peas, leafy green vegetables, and organ meats (liver, kidney). What side effects may I notice from receiving this medication? Side effects that you should report to your care team as soon as possible: Allergic reactions--skin rash, itching, hives, swelling of the face, lips, tongue, or throat Low blood pressure--dizziness, feeling faint or lightheaded, blurry vision Shortness of breath Side effects that usually do not require medical attention (report to your care team if they continue or are bothersome): Flushing Headache Joint pain Muscle pain Nausea Pain, redness, or irritation at injection site This list may not describe all possible side effects. Call your doctor for medical advice about side effects. You may report side effects to FDA at 1-800-FDA-1088. Where should I keep my medication? This medication is given in a hospital or clinic. It will not be stored at home. NOTE: This sheet is a summary. It may not cover all possible information. If you have questions about this medicine, talk to your doctor, pharmacist, or health care provider.  2024 Elsevier/Gold Standard (2023-04-12 00:00:00)

## 2023-10-23 NOTE — Progress Notes (Signed)
Pt observed for 30 minutes post Venofer injection. Pt tolerated Tx well w/out incident. VSS at discharge.  Ambulatory to lobby.

## 2023-10-31 ENCOUNTER — Inpatient Hospital Stay: Payer: 59

## 2023-10-31 VITALS — BP 111/80 | HR 87 | Temp 98.8°F | Resp 16

## 2023-10-31 DIAGNOSIS — D509 Iron deficiency anemia, unspecified: Secondary | ICD-10-CM | POA: Diagnosis not present

## 2023-10-31 MED ORDER — IRON SUCROSE 20 MG/ML IV SOLN
200.0000 mg | Freq: Once | INTRAVENOUS | Status: AC
  Administered 2023-10-31: 200 mg via INTRAVENOUS
  Filled 2023-10-31: qty 10

## 2023-10-31 MED ORDER — SODIUM CHLORIDE 0.9% FLUSH
10.0000 mL | Freq: Two times a day (BID) | INTRAVENOUS | Status: DC
  Administered 2023-10-31: 10 mL via INTRAVENOUS

## 2023-10-31 NOTE — Patient Instructions (Signed)
Iron Sucrose Injection What is this medication? IRON SUCROSE (EYE ern SOO krose) treats low levels of iron (iron deficiency anemia) in people with kidney disease. Iron is a mineral that plays an important role in making red blood cells, which carry oxygen from your lungs to the rest of your body. This medicine may be used for other purposes; ask your health care provider or pharmacist if you have questions. COMMON BRAND NAME(S): Venofer What should I tell my care team before I take this medication? They need to know if you have any of these conditions: Anemia not caused by low iron levels Heart disease High levels of iron in the blood Kidney disease Liver disease An unusual or allergic reaction to iron, other medications, foods, dyes, or preservatives Pregnant or trying to get pregnant Breastfeeding How should I use this medication? This medication is for infusion into a vein. It is given in a hospital or clinic setting. Talk to your care team about the use of this medication in children. While this medication may be prescribed for children as young as 2 years for selected conditions, precautions do apply. Overdosage: If you think you have taken too much of this medicine contact a poison control center or emergency room at once. NOTE: This medicine is only for you. Do not share this medicine with others. What if I miss a dose? Keep appointments for follow-up doses. It is important not to miss your dose. Call your care team if you are unable to keep an appointment. What may interact with this medication? Do not take this medication with any of the following: Deferoxamine Dimercaprol Other iron products This medication may also interact with the following: Chloramphenicol Deferasirox This list may not describe all possible interactions. Give your health care provider a list of all the medicines, herbs, non-prescription drugs, or dietary supplements you use. Also tell them if you smoke,  drink alcohol, or use illegal drugs. Some items may interact with your medicine. What should I watch for while using this medication? Visit your care team regularly. Tell your care team if your symptoms do not start to get better or if they get worse. You may need blood work done while you are taking this medication. You may need to follow a special diet. Talk to your care team. Foods that contain iron include: whole grains/cereals, dried fruits, beans, or peas, leafy green vegetables, and organ meats (liver, kidney). What side effects may I notice from receiving this medication? Side effects that you should report to your care team as soon as possible: Allergic reactions--skin rash, itching, hives, swelling of the face, lips, tongue, or throat Low blood pressure--dizziness, feeling faint or lightheaded, blurry vision Shortness of breath Side effects that usually do not require medical attention (report to your care team if they continue or are bothersome): Flushing Headache Joint pain Muscle pain Nausea Pain, redness, or irritation at injection site This list may not describe all possible side effects. Call your doctor for medical advice about side effects. You may report side effects to FDA at 1-800-FDA-1088. Where should I keep my medication? This medication is given in a hospital or clinic. It will not be stored at home. NOTE: This sheet is a summary. It may not cover all possible information. If you have questions about this medicine, talk to your doctor, pharmacist, or health care provider.  2024 Elsevier/Gold Standard (2023-04-12 00:00:00)

## 2023-11-06 ENCOUNTER — Inpatient Hospital Stay: Payer: 59

## 2023-11-06 VITALS — BP 118/63 | HR 82 | Temp 98.8°F | Resp 16

## 2023-11-06 DIAGNOSIS — D509 Iron deficiency anemia, unspecified: Secondary | ICD-10-CM | POA: Diagnosis not present

## 2023-11-06 MED ORDER — SODIUM CHLORIDE 0.9% FLUSH
10.0000 mL | Freq: Two times a day (BID) | INTRAVENOUS | Status: DC
Start: 2023-11-06 — End: 2023-11-06
  Administered 2023-11-06: 10 mL via INTRAVENOUS

## 2023-11-06 MED ORDER — IRON SUCROSE 20 MG/ML IV SOLN
200.0000 mg | Freq: Once | INTRAVENOUS | Status: AC
  Administered 2023-11-06: 200 mg via INTRAVENOUS
  Filled 2023-11-06: qty 10

## 2023-11-06 NOTE — Patient Instructions (Signed)
Iron Sucrose Injection What is this medication? IRON SUCROSE (EYE ern SOO krose) treats low levels of iron (iron deficiency anemia) in people with kidney disease. Iron is a mineral that plays an important role in making red blood cells, which carry oxygen from your lungs to the rest of your body. This medicine may be used for other purposes; ask your health care provider or pharmacist if you have questions. COMMON BRAND NAME(S): Venofer What should I tell my care team before I take this medication? They need to know if you have any of these conditions: Anemia not caused by low iron levels Heart disease High levels of iron in the blood Kidney disease Liver disease An unusual or allergic reaction to iron, other medications, foods, dyes, or preservatives Pregnant or trying to get pregnant Breastfeeding How should I use this medication? This medication is for infusion into a vein. It is given in a hospital or clinic setting. Talk to your care team about the use of this medication in children. While this medication may be prescribed for children as young as 2 years for selected conditions, precautions do apply. Overdosage: If you think you have taken too much of this medicine contact a poison control center or emergency room at once. NOTE: This medicine is only for you. Do not share this medicine with others. What if I miss a dose? Keep appointments for follow-up doses. It is important not to miss your dose. Call your care team if you are unable to keep an appointment. What may interact with this medication? Do not take this medication with any of the following: Deferoxamine Dimercaprol Other iron products This medication may also interact with the following: Chloramphenicol Deferasirox This list may not describe all possible interactions. Give your health care provider a list of all the medicines, herbs, non-prescription drugs, or dietary supplements you use. Also tell them if you smoke,  drink alcohol, or use illegal drugs. Some items may interact with your medicine. What should I watch for while using this medication? Visit your care team regularly. Tell your care team if your symptoms do not start to get better or if they get worse. You may need blood work done while you are taking this medication. You may need to follow a special diet. Talk to your care team. Foods that contain iron include: whole grains/cereals, dried fruits, beans, or peas, leafy green vegetables, and organ meats (liver, kidney). What side effects may I notice from receiving this medication? Side effects that you should report to your care team as soon as possible: Allergic reactions--skin rash, itching, hives, swelling of the face, lips, tongue, or throat Low blood pressure--dizziness, feeling faint or lightheaded, blurry vision Shortness of breath Side effects that usually do not require medical attention (report to your care team if they continue or are bothersome): Flushing Headache Joint pain Muscle pain Nausea Pain, redness, or irritation at injection site This list may not describe all possible side effects. Call your doctor for medical advice about side effects. You may report side effects to FDA at 1-800-FDA-1088. Where should I keep my medication? This medication is given in a hospital or clinic. It will not be stored at home. NOTE: This sheet is a summary. It may not cover all possible information. If you have questions about this medicine, talk to your doctor, pharmacist, or health care provider.  2024 Elsevier/Gold Standard (2023-04-12 00:00:00)

## 2023-11-06 NOTE — Progress Notes (Signed)
Pt observed for 30 min post Venofer injection. Tolerated well. No adverse s/s noted. VSS. Ambulatory to lobby for d/c. Declined AVS.

## 2023-11-25 ENCOUNTER — Encounter (HOSPITAL_COMMUNITY): Payer: Self-pay | Admitting: *Deleted

## 2023-11-25 ENCOUNTER — Emergency Department (HOSPITAL_COMMUNITY): Payer: 59

## 2023-11-25 ENCOUNTER — Emergency Department (HOSPITAL_COMMUNITY)
Admission: EM | Admit: 2023-11-25 | Discharge: 2023-11-25 | Discharge status: Against Medical Advice | Payer: 59 | Attending: Emergency Medicine | Admitting: Emergency Medicine

## 2023-11-25 ENCOUNTER — Other Ambulatory Visit: Payer: Self-pay

## 2023-11-25 DIAGNOSIS — Z87442 Personal history of urinary calculi: Secondary | ICD-10-CM | POA: Diagnosis not present

## 2023-11-25 DIAGNOSIS — E871 Hypo-osmolality and hyponatremia: Secondary | ICD-10-CM | POA: Diagnosis not present

## 2023-11-25 DIAGNOSIS — E876 Hypokalemia: Secondary | ICD-10-CM | POA: Diagnosis not present

## 2023-11-25 DIAGNOSIS — R1031 Right lower quadrant pain: Secondary | ICD-10-CM | POA: Insufficient documentation

## 2023-11-25 DIAGNOSIS — R110 Nausea: Secondary | ICD-10-CM | POA: Insufficient documentation

## 2023-11-25 LAB — COMPREHENSIVE METABOLIC PANEL
ALT: 17 U/L (ref 0–44)
AST: 17 U/L (ref 15–41)
Albumin: 4.7 g/dL (ref 3.5–5.0)
Alkaline Phosphatase: 43 U/L (ref 38–126)
Anion gap: 10 (ref 5–15)
BUN: 13 mg/dL (ref 6–20)
CO2: 22 mmol/L (ref 22–32)
Calcium: 9.5 mg/dL (ref 8.9–10.3)
Chloride: 102 mmol/L (ref 98–111)
Creatinine, Ser: 0.6 mg/dL (ref 0.44–1.00)
GFR, Estimated: >60 mL/min (ref >60)
Glucose, Bld: 89 mg/dL (ref 70–99)
Potassium: 3.2 mmol/L — ABNORMAL LOW (ref 3.5–5.1)
Sodium: 134 mmol/L — ABNORMAL LOW (ref 135–145)
Total Bilirubin: 0.6 mg/dL (ref 0.0–1.2)
Total Protein: 7.9 g/dL (ref 6.5–8.1)

## 2023-11-25 LAB — URINALYSIS, ROUTINE W REFLEX MICROSCOPIC
Bacteria, UA: NONE SEEN
Bilirubin Urine: NEGATIVE
Glucose, UA: NEGATIVE mg/dL
Ketones, ur: 5 mg/dL — AB
Leukocytes,Ua: NEGATIVE
Nitrite: NEGATIVE
Protein, ur: NEGATIVE mg/dL
Specific Gravity, Urine: 1.015 (ref 1.005–1.030)
pH: 7 (ref 5.0–8.0)

## 2023-11-25 LAB — CBC
HCT: 39.9 % (ref 36.0–46.0)
Hemoglobin: 13.2 g/dL (ref 12.0–15.0)
MCH: 28.9 pg (ref 26.0–34.0)
MCHC: 33.1 g/dL (ref 30.0–36.0)
MCV: 87.3 fL (ref 80.0–100.0)
Platelets: 278 10*3/uL (ref 150–400)
RBC: 4.57 MIL/uL (ref 3.87–5.11)
RDW: 13.5 % (ref 11.5–15.5)
WBC: 9.6 10*3/uL (ref 4.0–10.5)
nRBC: 0 % (ref 0.0–0.2)

## 2023-11-25 LAB — LIPASE, BLOOD: Lipase: 44 U/L (ref 11–51)

## 2023-11-25 LAB — HCG, SERUM, QUALITATIVE: Preg, Serum: NEGATIVE

## 2023-11-25 MED ORDER — IOHEXOL 300 MG/ML  SOLN: 100.0000 mL | Freq: Once | INTRAMUSCULAR | Status: DC | PRN

## 2023-11-25 MED ORDER — ONDANSETRON HCL 4 MG/2ML IJ SOLN
4.0000 mg | Freq: Once | INTRAMUSCULAR | Status: AC
Start: 2023-11-25 — End: 2023-11-25
  Administered 2023-11-25: 4 mg via INTRAVENOUS
  Filled 2023-11-25: qty 2

## 2023-11-25 MED ORDER — OXYCODONE-ACETAMINOPHEN 5-325 MG PO TABS
1.0000 | ORAL_TABLET | Freq: Once | ORAL | Status: AC
  Administered 2023-11-25: 1 via ORAL
  Filled 2023-11-25: qty 1

## 2023-11-25 NOTE — ED Provider Notes (Signed)
 [facility] AT [facility] Provider Note   CSN: [id number] Arrival date & time: 11/25/23  1423     History  Chief Complaint  Patient presents with   Abdominal Pain    Anita Woods is a 33 y.o. female past medical history significant for ovarian cysts and kidney stones presents today for right lower quadrant pain since Friday.  Patient states it started out intermittent has become more consistent.  She endorses nausea and right sided flank and back pain.  Patient denies fever, chills, hematuria, burning with urination, vomiting, or diarrhea.  Patient states her last cycle was 10/31/2023.   Abdominal Pain Associated symptoms: nausea        Home Medications Prior to Admission medications   Medication Sig Start Date End Date Taking? Authorizing Provider  AMBULATORY NON FORMULARY MEDICATION Medication Name: OTC Vitamin D and Vitamin b12 one tablet once daily    [provider]  ferrous sulfate 325 (65 FE) MG tablet Take 325 mg by mouth daily with breakfast.    [provider]  FLUoxetine (PROZAC) 20 MG capsule Take 20 mg by mouth every morning. 09/25/23   [provider]  gabapentin (NEURONTIN) 100 MG capsule Take 100 mg by mouth daily.    [provider]  omeprazole  (PRILOSEC) 40 MG capsule Take 1 capsule (40 mg total) by mouth in the morning and at bedtime. 05/03/23   Stacia Glendia BRAVO, MD  propranolol (INDERAL) 10 MG tablet Take 10 mg by mouth as needed. 07/16/23   [provider]      Allergies    Patient has no known allergies.    Review of Systems   Review of Systems  Gastrointestinal:  Positive for abdominal pain and nausea.  Genitourinary:  Positive for flank pain and pelvic pain.    Physical Exam Updated Vital Signs BP 117/73 (BP Location: Left Arm)   Pulse 83   Temp 98.1 F (36.7 C)   Resp 16   Ht 5' 8 (1.727 m)   Wt 70.3 kg   LMP 10/31/2023   SpO2 100%   BMI 23.57 kg/m  Physical  Exam Vitals and nursing note reviewed.  Constitutional:      General: She is not in acute distress.    Appearance: She is well-developed.  HENT:     Head: Normocephalic and atraumatic.  Eyes:     Extraocular Movements: Extraocular movements intact.     Conjunctiva/sclera: Conjunctivae normal.  Cardiovascular:     Rate and Rhythm: Normal rate and regular rhythm.     Heart sounds: Normal heart sounds. No murmur heard. Pulmonary:     Effort: Pulmonary effort is normal. No respiratory distress.     Breath sounds: Normal breath sounds.  Abdominal:     General: Abdomen is flat. Bowel sounds are normal.     Palpations: Abdomen is soft.     Tenderness: There is abdominal tenderness in the right lower quadrant and left lower quadrant. There is right CVA tenderness and guarding. There is no rebound. Negative signs include Murphy's sign and McBurney's sign.  Musculoskeletal:        General: No swelling.     Cervical back: Neck supple.  Skin:    General: Skin is warm and dry.     Capillary Refill: Capillary refill takes less than 2 seconds.  Neurological:     General: No focal deficit present.     Mental Status: She is alert.  Psychiatric:  Mood and Affect: Mood normal.     ED Results / Procedures / Treatments   Labs (all labs ordered are listed, but only abnormal results are displayed) Labs Reviewed  COMPREHENSIVE METABOLIC PANEL - Abnormal; Notable for the following components:      Result Value   Sodium 134 (*)    Potassium 3.2 (*)    All other components within normal limits  URINALYSIS, ROUTINE W REFLEX MICROSCOPIC - Abnormal; Notable for the following components:   Hgb urine dipstick MODERATE (*)    Ketones, ur 5 (*)    All other components within normal limits  LIPASE, BLOOD  CBC  HCG, SERUM, QUALITATIVE    EKG None  Radiology US  Pelvis Complete Result Date: 11/25/2023 CLINICAL DATA:  Pelvic pain EXAM: TRANSABDOMINAL AND TRANSVAGINAL ULTRASOUND OF PELVIS  DOPPLER ULTRASOUND OF OVARIES TECHNIQUE: Both transabdominal and transvaginal ultrasound examinations of the pelvis were performed. Transabdominal technique was performed for global imaging of the pelvis including uterus, ovaries, adnexal regions, and pelvic cul-de-sac. It was necessary to proceed with endovaginal exam following the transabdominal exam to visualize the ovaries. Color and duplex Doppler ultrasound was utilized to evaluate blood flow to the ovaries. COMPARISON:  12/28/2021 FINDINGS: Uterus Measurements: 6.8 x 3.4 x 3.7 cm = volume: 45 mL. No fibroids or other mass visualized. Endometrium Thickness: 0.5 cm.  No focal abnormality visualized. Right ovary Measurements: 4.0 x 1.9 x 2.8 cm = volume: 11 mL. Normal appearance/no adnexal mass. Small follicles. Left ovary Measurements: 3.1 x 2.3 x 2.2 cm = volume: 8 mL. Normal appearance/no adnexal mass. Small follicles. Pulsed Doppler evaluation of both ovaries demonstrates normal low-resistance arterial and venous waveforms. Other findings Trace free fluid in the low pelvis. IMPRESSION: 1. No ultrasound abnormality of the pelvis to explain pain. 2. Trace free fluid in the low pelvis, likely physiologic. Electronically Signed   By: Marolyn JONETTA Jaksch M.D.   On: 11/25/2023 17:08   US  Transvaginal Non-OB Result Date: 11/25/2023 CLINICAL DATA:  Pelvic pain EXAM: TRANSABDOMINAL AND TRANSVAGINAL ULTRASOUND OF PELVIS DOPPLER ULTRASOUND OF OVARIES TECHNIQUE: Both transabdominal and transvaginal ultrasound examinations of the pelvis were performed. Transabdominal technique was performed for global imaging of the pelvis including uterus, ovaries, adnexal regions, and pelvic cul-de-sac. It was necessary to proceed with endovaginal exam following the transabdominal exam to visualize the ovaries. Color and duplex Doppler ultrasound was utilized to evaluate blood flow to the ovaries. COMPARISON:  12/28/2021 FINDINGS: Uterus Measurements: 6.8 x 3.4 x 3.7 cm = volume: 45 mL. No  fibroids or other mass visualized. Endometrium Thickness: 0.5 cm.  No focal abnormality visualized. Right ovary Measurements: 4.0 x 1.9 x 2.8 cm = volume: 11 mL. Normal appearance/no adnexal mass. Small follicles. Left ovary Measurements: 3.1 x 2.3 x 2.2 cm = volume: 8 mL. Normal appearance/no adnexal mass. Small follicles. Pulsed Doppler evaluation of both ovaries demonstrates normal low-resistance arterial and venous waveforms. Other findings Trace free fluid in the low pelvis. IMPRESSION: 1. No ultrasound abnormality of the pelvis to explain pain. 2. Trace free fluid in the low pelvis, likely physiologic. Electronically Signed   By: Marolyn JONETTA Jaksch M.D.   On: 11/25/2023 17:08   US  Art/Ven Flow Abd Pelv Doppler Result Date: 11/25/2023 CLINICAL DATA:  Pelvic pain EXAM: TRANSABDOMINAL AND TRANSVAGINAL ULTRASOUND OF PELVIS DOPPLER ULTRASOUND OF OVARIES TECHNIQUE: Both transabdominal and transvaginal ultrasound examinations of the pelvis were performed. Transabdominal technique was performed for global imaging of the pelvis including uterus, ovaries, adnexal regions, and pelvic cul-de-sac. It was  necessary to proceed with endovaginal exam following the transabdominal exam to visualize the ovaries. Color and duplex Doppler ultrasound was utilized to evaluate blood flow to the ovaries. COMPARISON:  12/28/2021 FINDINGS: Uterus Measurements: 6.8 x 3.4 x 3.7 cm = volume: 45 mL. No fibroids or other mass visualized. Endometrium Thickness: 0.5 cm.  No focal abnormality visualized. Right ovary Measurements: 4.0 x 1.9 x 2.8 cm = volume: 11 mL. Normal appearance/no adnexal mass. Small follicles. Left ovary Measurements: 3.1 x 2.3 x 2.2 cm = volume: 8 mL. Normal appearance/no adnexal mass. Small follicles. Pulsed Doppler evaluation of both ovaries demonstrates normal low-resistance arterial and venous waveforms. Other findings Trace free fluid in the low pelvis. IMPRESSION: 1. No ultrasound abnormality of the pelvis to explain  pain. 2. Trace free fluid in the low pelvis, likely physiologic. Electronically Signed   By: Marolyn JONETTA Jaksch M.D.   On: 11/25/2023 17:08    Procedures Procedures    Medications Ordered in ED Medications  iohexol  (OMNIPAQUE ) 300 MG/ML solution 100 mL (has no administration in time range)  oxyCODONE -acetaminophen  (PERCOCET/ROXICET) 5-325 MG per tablet 1 tablet (1 tablet Oral Given 11/25/23 1636)  ondansetron  (ZOFRAN ) injection 4 mg (4 mg Intravenous Given 11/25/23 1636)    ED Course/ Medical Decision Making/ A&P                                 Medical Decision Making Amount and/or Complexity of Data Reviewed Labs: ordered. Radiology: ordered.   This patient presents to the ED with chief complaint(s) of right sided abdominal and flank pain with pertinent past medical history of kidney stones and ovarian cysts which further complicates the presenting complaint. The complaint involves an extensive differential diagnosis and also carries with it a high risk of complications and morbidity.    The differential diagnosis includes ovarian cyst, ovarian torsion, kidney stone, appendicitis, diverticulitis  Additional history obtained: Additional history obtained from significant other Records reviewed Care Everywhere/External Records  ED Course and Reassessment:   Independent labs interpretation:  The following labs were independently interpreted:  CBC: No notable findings CMP: Mild hyponatremia and hypokalemia Lipase: 44 UA: Moderate hemoglobin, 5 ketones hCG: Negative  Independent visualization of imaging: - I independently visualized the following imaging with scope of interpretation limited to determining acute life threatening conditions related to emergency care: Ultrasound arterial/venous flow, pelvis complete, transvaginal non-OB, which revealed no ultrasound abnormality of the pelvis to explain pain, trace free fluid in the low pelvis, likely physiologic. CT abdomen pelvis with  contrast: Patient refused CT abdomen pelvis and states that she would like to go home.  Discussed with patient that we could not rule out pathologies including but not limited to kidney stone, diverticulitis, and appendicitis.  Refusing to get this test done and opting for discharge instead could lead to infection, permanent disability, future need for surgery, or even death.  Patient has decision-making capacity and still would like to be discharged.  Patient encouraged to return to ED if symptoms persist or worsen.  Consultation: - Consulted or discussed management/test interpretation w/ external professional: None  Patient wishes to leave AGAINST MEDICAL ADVICE. I personally explained need for further testing and my concerns for adverse outcomes if workup is incomplete. Specific concerns explained to patient include worsening symptoms, functional loss, long term sickness and death. Patient states understanding of risks and states they will return if they feel the need to at a later date to  receive the recommended care or any other care at any time, regardless of their ability to pay for such care. Patient understands they are able to return at any time. Patient is able to explain back the risks of leaving AMA and still wishes to leave.   Specifically I recommend CT abdomen pelvis with contrast to evaluate and exclude diverticulitis, appendicitis, kidney stone.  The patient is oriented to person, place, and time, has the capacity to make decisions regarding the medical care offered. The patient speaks coherently and exhibits no evidence of having an altered level of consciousness or alcohol or drug intoxication to a point that would impair judgment. They respond knowingly to questions about recommended treatment and alternate treatments including no further testing or treatment; participate in diagnostic and treatment decisions by means of rational thought processes; and understand the items of minimum  basic medical treatment information with respect to that treatment (the nature and seriousness of the illness, the nature of the treatment, the probable degree and duration of any benefits and risks of any medical intervention that is being recommended, and the consequences of lack of treatment, and the nature, risks, and benefits of any reasonable alternatives).  The patient understands the relevant information of the nature of their medical condition, as well as the risks, benefits, and treatment alternatives (including non-treatment), consequences of refusing care, and can competently communicate a rational explanation about their choice of care options.   Included in AVS was the following message:  You have chosen to leave AGAINST MEDICAL ADVICE. Should you change your mind, you are always welcome and encouraged to return to the ED. You are encouraged to follow-up with, at the very least, a primary care provider, or other similar medical professional on this matter.         Final Clinical Impression(s) / ED Diagnoses Final diagnoses:  Right lower quadrant abdominal pain    Rx / DC Orders ED Discharge Orders     None         Francis Ileana SAILOR, PA-C 11/25/23 1933    Ruthe Cornet, DO 11/25/23 2345

## 2023-11-25 NOTE — ED Triage Notes (Signed)
 RLQ  to mid abd pain since Friday. Last cycle 10/31/23. No rebound pain, nausea, flank and back pain with it.

## 2023-11-25 NOTE — Discharge Instructions (Signed)
 You have chosen to leave AGAINST MEDICAL ADVICE. Should you change your mind, you are always welcome and encouraged to return to the ED. You are encouraged to follow-up with, at the very least, a primary care provider, or other similar medical professional on this matter.

## 2023-11-29 IMAGING — US US PELVIS COMPLETE TRANSABD/TRANSVAG W DUPLEX AND/OR DOPPLER
1 series · 13 of 25 positions shown · non-contrast
Comparison: March 19, 2013.

CLINICAL DATA: Vaginal bleeding.

EXAM:
TRANSABDOMINAL AND TRANSVAGINAL ULTRASOUND OF PELVIS
DOPPLER ULTRASOUND OF OVARIES
TECHNIQUE: Both transabdominal and transvaginal ultrasound examinations of the
pelvis were performed. Transabdominal technique was performed for
global imaging of the pelvis including uterus, ovaries, adnexal
regions, and pelvic cul-de-sac.
It was necessary to proceed with endovaginal exam following the
transabdominal exam to visualize the endometrium and ovaries. Color
and duplex Doppler ultrasound was utilized to evaluate blood flow to
the ovaries.

[Series 1: gyn us · 13 of 114 slices shown]
[im 1/114]
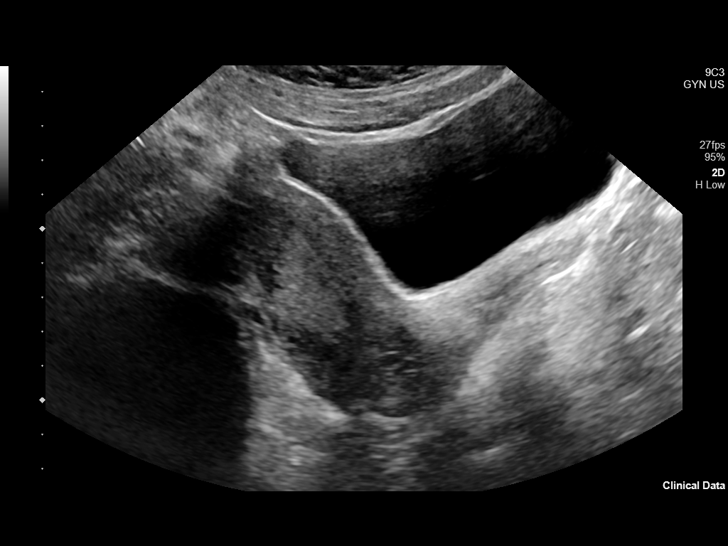
[im 10/114]
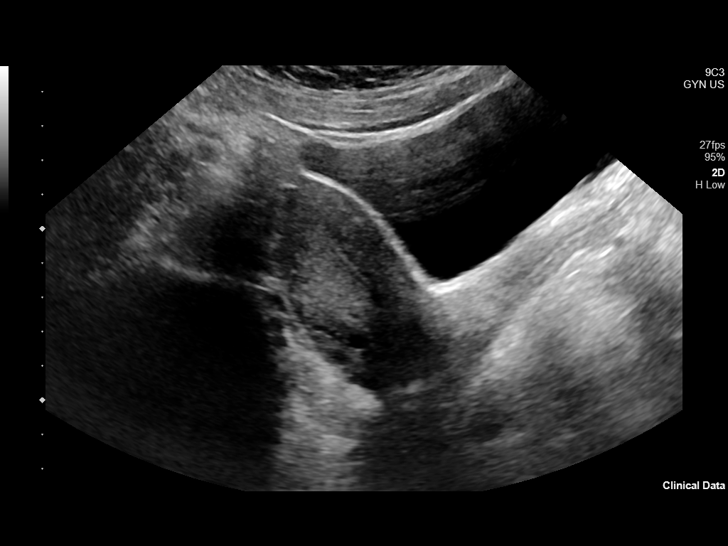
[im 19/114]
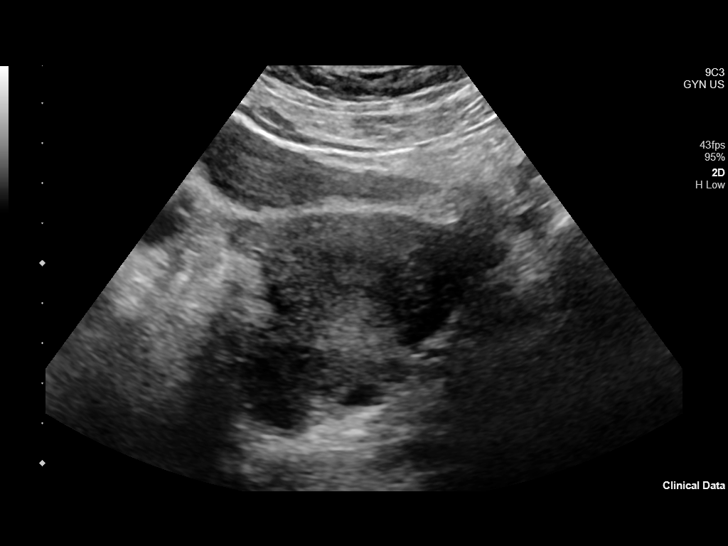
[im 29/114]
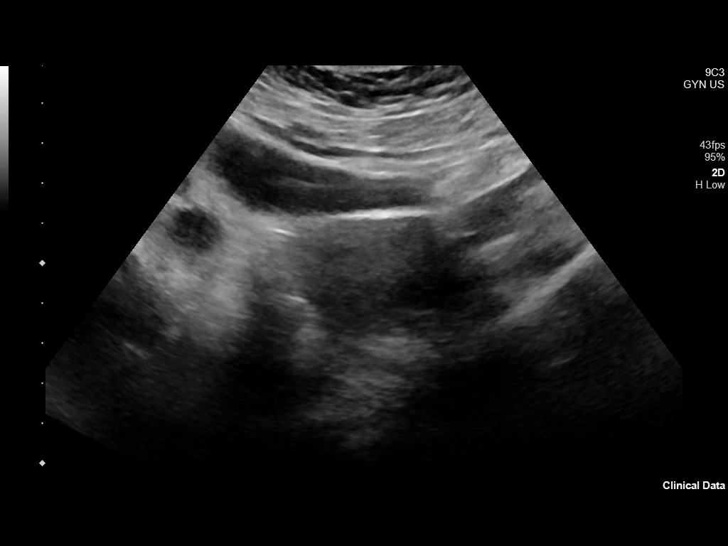
[im 38/114]
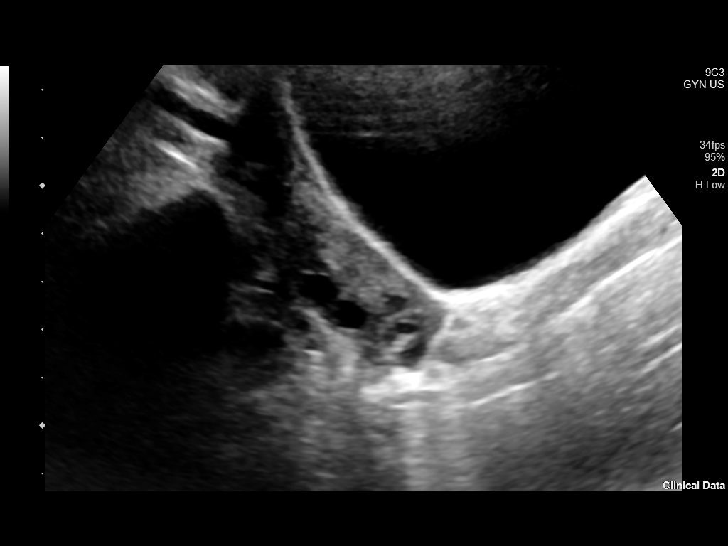
[im 48/114]
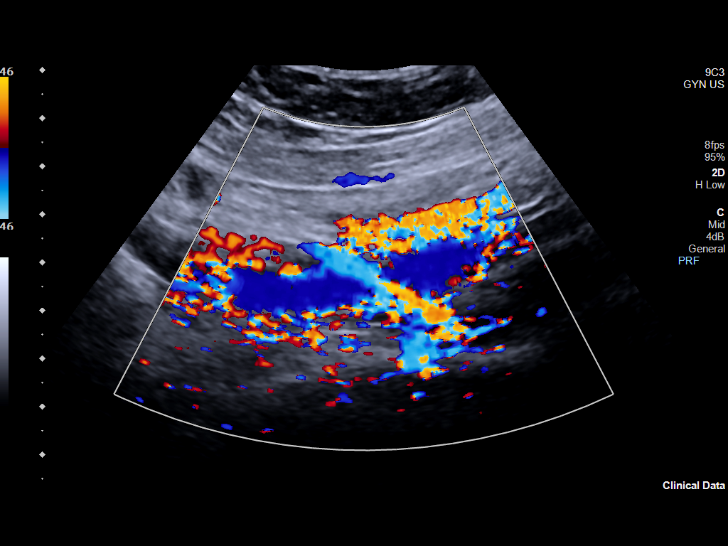
[im 57/114]
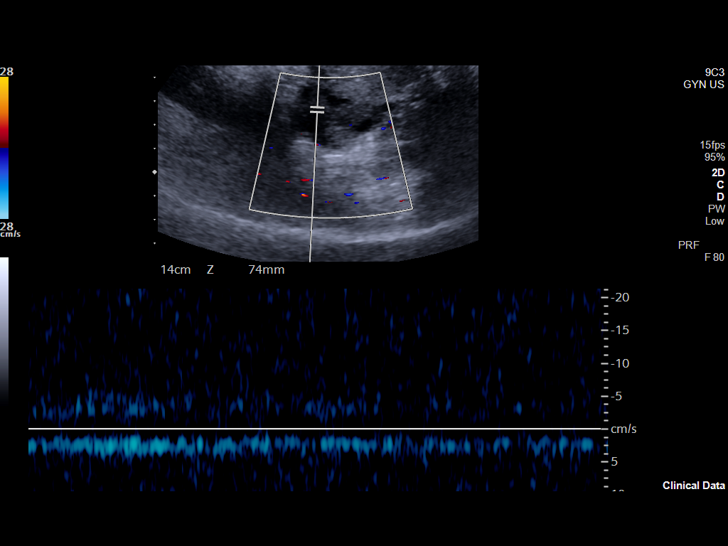
[im 66/114]
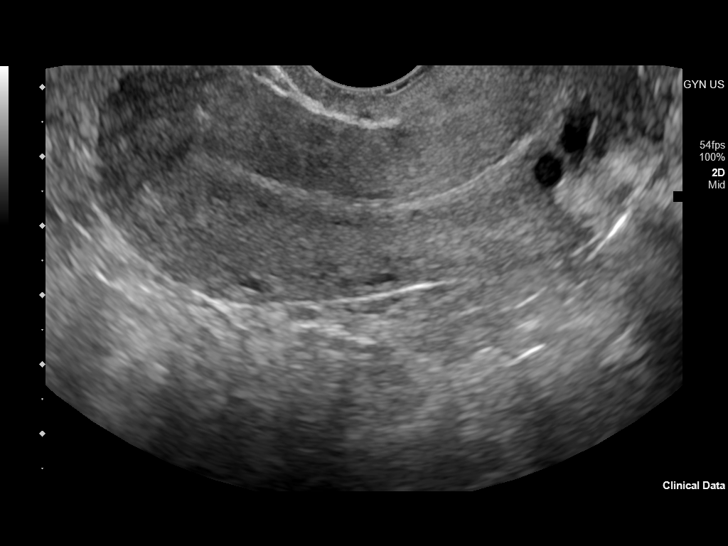
[im 76/114]
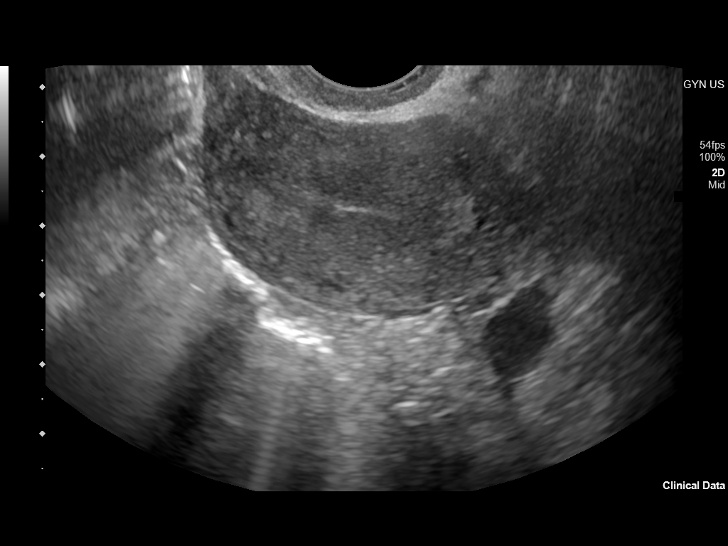
[im 85/114]
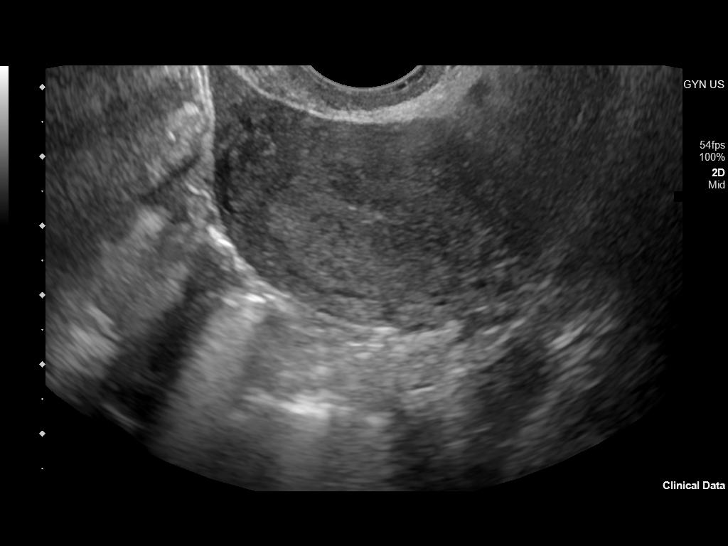
[im 95/114]
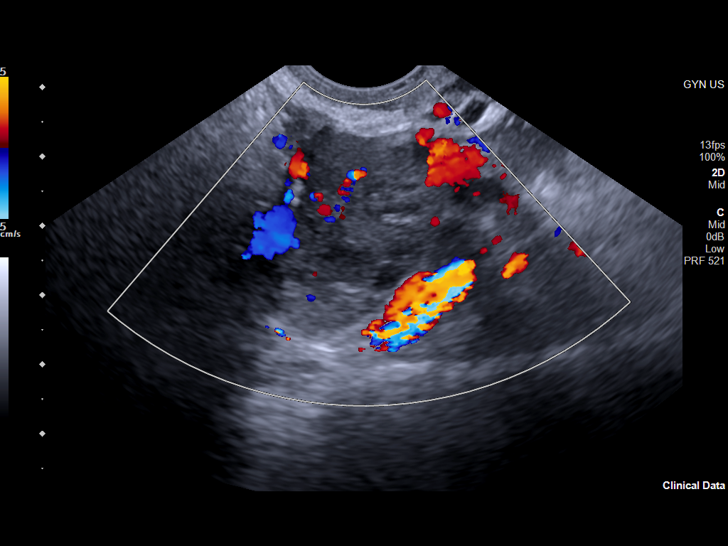
[im 104/114]
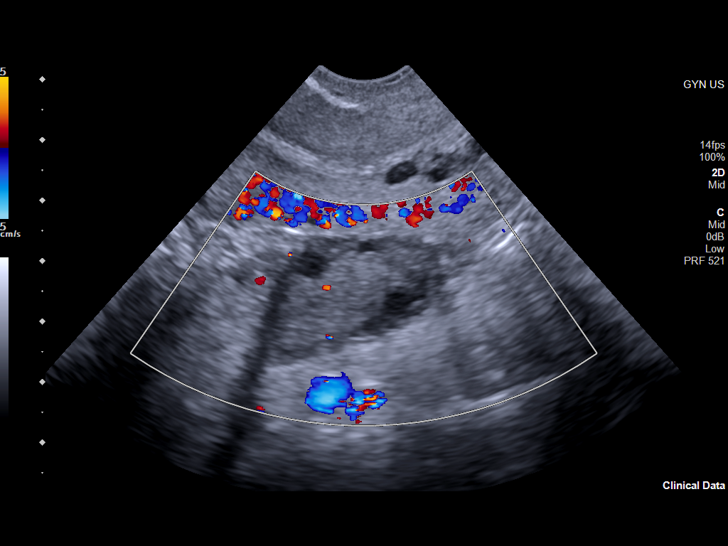
[im 114/114]
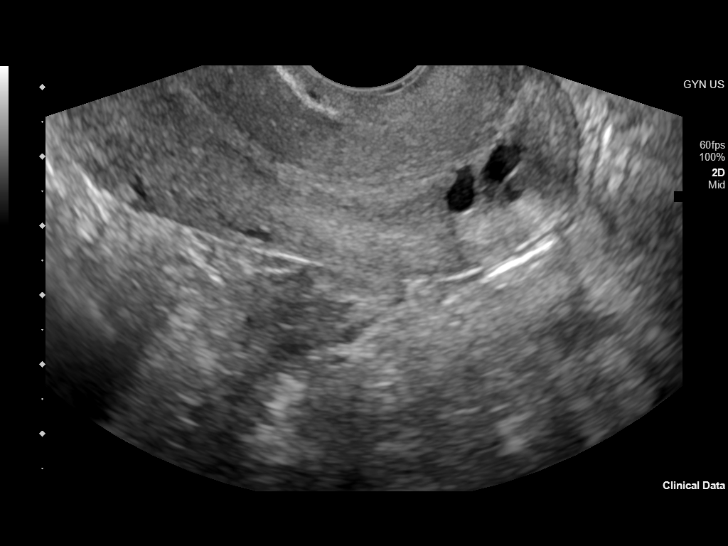

[13 of 25 positions shown; findings below may reference images not displayed]

FINDINGS: Uterus

Measurements: 8.2 x 4.6 x 3.5 cm = volume: 69 mL. No fibroids or
other mass visualized.

Endometrium

Thickness: 3 mm which is within normal limits. No focal abnormality
visualized.

Right ovary

Measurements: 3.0 x 2.8 x 2.4 cm = volume: 11 mL. Normal
appearance/no adnexal mass.

Left ovary

Measurements: 3.4 x 2.0 x 1.9 cm = volume: 7 mL. Normal
appearance/no adnexal mass.

Pulsed Doppler evaluation of both ovaries demonstrates normal
low-resistance arterial and venous waveforms.

Other findings

No abnormal free fluid.
IMPRESSION: No evidence of ovarian mass or torsion. No definite abnormality seen
in the pelvis.

## 2023-12-03 ENCOUNTER — Other Ambulatory Visit: Payer: Self-pay | Admitting: Gastroenterology

## 2023-12-03 DIAGNOSIS — K219 Gastro-esophageal reflux disease without esophagitis: Secondary | ICD-10-CM

## 2023-12-03 DIAGNOSIS — Z0001 Encounter for general adult medical examination with abnormal findings: Secondary | ICD-10-CM

## 2024-01-31 ENCOUNTER — Telehealth: Payer: Self-pay

## 2024-01-31 NOTE — Telephone Encounter (Signed)
 Left message on voicemail for patient about appointment on 02/03/24

## 2024-02-03 ENCOUNTER — Inpatient Hospital Stay: Payer: 59

## 2024-02-03 ENCOUNTER — Inpatient Hospital Stay: Payer: 59 | Attending: Hematology and Oncology | Admitting: Hematology and Oncology

## 2024-02-03 ENCOUNTER — Encounter: Payer: Self-pay | Admitting: *Deleted

## 2024-02-03 ENCOUNTER — Other Ambulatory Visit: Payer: Self-pay

## 2024-02-03 VITALS — BP 104/67 | HR 82 | Temp 98.0°F | Resp 16 | Wt 148.3 lb

## 2024-02-03 DIAGNOSIS — D509 Iron deficiency anemia, unspecified: Secondary | ICD-10-CM | POA: Insufficient documentation

## 2024-02-03 DIAGNOSIS — G43909 Migraine, unspecified, not intractable, without status migrainosus: Secondary | ICD-10-CM | POA: Insufficient documentation

## 2024-02-03 DIAGNOSIS — Z79899 Other long term (current) drug therapy: Secondary | ICD-10-CM | POA: Insufficient documentation

## 2024-02-03 LAB — CBC WITH DIFFERENTIAL/PLATELET
Abs Immature Granulocytes: 0.02 10*3/uL (ref 0.00–0.07)
Basophils Absolute: 0 10*3/uL (ref 0.0–0.1)
Basophils Relative: 0 %
Eosinophils Absolute: 0.2 10*3/uL (ref 0.0–0.5)
Eosinophils Relative: 2 %
HCT: 40.3 % (ref 36.0–46.0)
Hemoglobin: 13.5 g/dL (ref 12.0–15.0)
Immature Granulocytes: 0 %
Lymphocytes Relative: 36 %
Lymphs Abs: 3.4 10*3/uL (ref 0.7–4.0)
MCH: 29.4 pg (ref 26.0–34.0)
MCHC: 33.5 g/dL (ref 30.0–36.0)
MCV: 87.8 fL (ref 80.0–100.0)
Monocytes Absolute: 0.6 10*3/uL (ref 0.1–1.0)
Monocytes Relative: 7 %
Neutro Abs: 5.2 10*3/uL (ref 1.7–7.7)
Neutrophils Relative %: 55 %
Platelets: 261 10*3/uL (ref 150–400)
RBC: 4.59 MIL/uL (ref 3.87–5.11)
RDW: 12.5 % (ref 11.5–15.5)
WBC: 9.4 10*3/uL (ref 4.0–10.5)
nRBC: 0 % (ref 0.0–0.2)

## 2024-02-03 LAB — FERRITIN: Ferritin: 108 ng/mL (ref 11–307)

## 2024-02-03 NOTE — Progress Notes (Signed)
 [facility] CONSULT NOTE  Patient Care Team: Elenore Paddy, NP as PCP - General (Nurse Practitioner)  CHIEF COMPLAINTS/PURPOSE OF CONSULTATION:  IDA  ASSESSMENT & PLAN:   This is a very pleasant 33 year-old young female patient with no significant past medical history except for history of cholecystectomy many years ago now diagnosed with iron deficiency anemia referred to hematology for the same.    Iron deficiency anemia Improved fatigue and ice craving. Hemoglobin normal at 13.5. Ferritin pending to evaluate iron stores. D - Monitor ferritin levels and follow up with results. - If ferritin normal, no further iron infusions needed. - If ferritin low, consider oral iron supplementation.  Migraine Migraine today, Consider Neurology referral suggested for newer treatments. - Encouraged to discuss with PCP - Refer to neurology for migraine management.  Depression and anxiety On fluoxetine 40 mg and gabapentin 300 mg per primary team.  HISTORY OF PRESENTING ILLNESS:   Anita Woods is a 33 year old female with iron deficiency anemia who presents for follow-up after iron supplementation.  She has experienced significant improvement in symptoms following iron supplementation, with increased energy levels and resolution of pica, specifically cravings for ice. No recent shortness of breath has been noted.  She is experiencing a migraine today, which she attributes to poor sleep the previous night. She usually takes gabapentin at night to aid sleep but did not take it last night as she fell asleep on the couch. She has a history of migraines since she was younger and has previously tried sumatriptan without success. She is not currently on any specific medication for migraines.  Her current medications include fluoxetine 40 mg and gabapentin 300 mg. No shortness of breath, and bowel movements and urination are normal.  Rest of the pertinent 10 point ROS reviewed and  neg.  MEDICAL HISTORY:  Past Medical History:  Diagnosis Date   Anemia    GERD (gastroesophageal reflux disease)    Ovarian cyst    Recurrent upper respiratory infection (URI)     SURGICAL HISTORY: Past Surgical History:  Procedure Laterality Date   GALLBLADDER SURGERY  2011   TONSILLECTOMY  2001   WISDOM TOOTH EXTRACTION Bilateral 04/19/2009    SOCIAL HISTORY: Social History   Socioeconomic History   Marital status: Single    Spouse name: Not on file   Number of children: Not on file   Years of education: Not on file   Highest education level: Bachelor's degree (e.g., BA, AB, BS)  Occupational History   Not on file  Tobacco Use   Smoking status: Never   Smokeless tobacco: Never  Vaping Use   Vaping status: Never Used  Substance and Sexual Activity   Alcohol use: No    Comment: socially   Drug use: No   Sexual activity: Yes    Birth control/protection: Condom  Other Topics Concern   Not on file  Social History Narrative   Not on file   Social Drivers of Health   Financial Resource Strain: Low Risk  (07/10/2023)   Overall Financial Resource Strain (CARDIA)    Difficulty of Paying Living Expenses: Not hard at all  Food Insecurity: No Food Insecurity (07/10/2023)   Hunger Vital Sign    Worried About Running Out of Food in the Last Year: Never true    Ran Out of Food in the Last Year: Never true  Transportation Needs: No Transportation Needs (07/10/2023)   PRAPARE - Transportation    Lack of Transportation (  Medical): No    Lack of Transportation (Non-Medical): No  Physical Activity: Insufficiently Active (07/10/2023)   Exercise Vital Sign    Days of Exercise per Week: 2 days    Minutes of Exercise per Session: 40 min  Stress: Stress Concern Present (07/10/2023)   [employer] of Occupational Health - Occupational Stress Questionnaire    Feeling of Stress : To some extent  Social Connections: Unknown (07/10/2023)   Social Connection and Isolation Panel  [NHANES]    Frequency of Communication with Friends and Family: Once a week    Frequency of Social Gatherings with Friends and Family: Patient declined    Attends Religious Services: Patient declined    Database administrator or Organizations: No    Attends Engineer, structural: Not on file    Marital Status: Living with partner  Intimate Partner Violence: Not on file    FAMILY HISTORY: Family History  Problem Relation Age of Onset   Diabetes Mother    High blood pressure Father    Migraines Paternal Aunt    Cancer Paternal Aunt    Diabetes Maternal Grandmother    Colon cancer Neg Hx    Esophageal cancer Neg Hx    Stomach cancer Neg Hx    Rectal cancer Neg Hx     ALLERGIES:  has no known allergies.  MEDICATIONS:  Current Outpatient Medications  Medication Sig Dispense Refill   AMBULATORY NON FORMULARY MEDICATION Medication Name: OTC Vitamin D and Vitamin b12 one tablet once daily     ferrous sulfate 325 (65 FE) MG tablet Take 325 mg by mouth daily with breakfast.     FLUoxetine (PROZAC) 20 MG capsule Take 40 mg by mouth every morning.     gabapentin (NEURONTIN) 100 MG capsule Take 300 mg by mouth daily.     omeprazole (PRILOSEC) 40 MG capsule TAKE 1 CAPSULE (40 MG TOTAL) BY MOUTH IN THE MORNING AND AT BEDTIME. 180 capsule 1   propranolol (INDERAL) 10 MG tablet Take 10 mg by mouth as needed.     No current facility-administered medications for this visit.     PHYSICAL EXAMINATION: ECOG PERFORMANCE STATUS: 0 - Asymptomatic  Vitals:   02/03/24 0835  BP: 104/67  Pulse: 82  Resp: 16  Temp: 98 F (36.7 C)  SpO2: 100%     Filed Weights   02/03/24 0835  Weight: 148 lb 4.8 oz (67.3 kg)      GENERAL:alert, no distress and comfortable Clear to auscultation bilaterally, Heart: RRR No LE edema  LABORATORY DATA:  I have reviewed the data as liste20d Lab Results  Component Value Date   WBC 9.4 02/03/2024   HGB 13.5 02/03/2024   HCT 40.3 02/03/2024    MCV 87.8 02/03/2024   PLT 261 02/03/2024     Chemistry      Component Value Date/Time   NA 134 (L) 11/25/2023 1635   K 3.2 (L) 11/25/2023 1635   CL 102 11/25/2023 1635   CO2 22 11/25/2023 1635   BUN 13 11/25/2023 1635   CREATININE 0.60 11/25/2023 1635      Component Value Date/Time   CALCIUM 9.5 11/25/2023 1635   ALKPHOS 43 11/25/2023 1635   AST 17 11/25/2023 1635   ALT 17 11/25/2023 1635   BILITOT 0.6 11/25/2023 1635       RADIOGRAPHIC STUDIES: I have personally reviewed the radiological images as listed and agreed with the findings in the report. No results found.  All questions were answered.  The patient knows to call the clinic with any problems, questions or concerns. I spent 20 minutes in the care of this patient including H and P, review of records, counseling and coordination of care.     Rachel Moulds, MD 02/03/2024 9:36 AM

## 2024-03-04 ENCOUNTER — Encounter: Payer: Self-pay | Admitting: Hematology and Oncology

## 2024-10-22 ENCOUNTER — Ambulatory Visit: Payer: Self-pay | Admitting: Nurse Practitioner

## 2024-10-22 VITALS — BP 118/76 | HR 82 | Temp 98.0°F | Ht 68.0 in | Wt 142.0 lb

## 2024-10-22 DIAGNOSIS — R5383 Other fatigue: Secondary | ICD-10-CM | POA: Insufficient documentation

## 2024-10-22 DIAGNOSIS — K625 Hemorrhage of anus and rectum: Secondary | ICD-10-CM | POA: Insufficient documentation

## 2024-10-22 DIAGNOSIS — R42 Dizziness and giddiness: Secondary | ICD-10-CM

## 2024-10-22 LAB — CBC WITH DIFFERENTIAL/PLATELET
Basophils Absolute: 0.1 K/uL (ref 0.0–0.1)
Basophils Relative: 1 % (ref 0.0–3.0)
Eosinophils Absolute: 0.1 K/uL (ref 0.0–0.7)
Eosinophils Relative: 1.7 % (ref 0.0–5.0)
HCT: 35.2 % — ABNORMAL LOW (ref 36.0–46.0)
Hemoglobin: 12.2 g/dL (ref 12.0–15.0)
Lymphocytes Relative: 36.8 % (ref 12.0–46.0)
Lymphs Abs: 3.1 K/uL (ref 0.7–4.0)
MCHC: 34.5 g/dL (ref 30.0–36.0)
MCV: 86 fl (ref 78.0–100.0)
Monocytes Absolute: 0.5 K/uL (ref 0.1–1.0)
Monocytes Relative: 5.8 % (ref 3.0–12.0)
Neutro Abs: 4.6 K/uL (ref 1.4–7.7)
Neutrophils Relative %: 54.7 % (ref 43.0–77.0)
Platelets: 257 K/uL (ref 150.0–400.0)
RBC: 4.1 Mil/uL (ref 3.87–5.11)
RDW: 13.1 % (ref 11.5–15.5)
WBC: 8.4 K/uL (ref 4.0–10.5)

## 2024-10-22 LAB — COMPREHENSIVE METABOLIC PANEL WITH GFR
ALT: 14 U/L (ref 0–35)
AST: 14 U/L (ref 0–37)
Albumin: 4.7 g/dL (ref 3.5–5.2)
Alkaline Phosphatase: 35 U/L — ABNORMAL LOW (ref 39–117)
BUN: 12 mg/dL (ref 6–23)
CO2: 27 meq/L (ref 19–32)
Calcium: 9.4 mg/dL (ref 8.4–10.5)
Chloride: 103 meq/L (ref 96–112)
Creatinine, Ser: 0.58 mg/dL (ref 0.40–1.20)
GFR: 119.66 mL/min (ref >60.00)
Glucose, Bld: 87 mg/dL (ref 70–99)
Potassium: 3.9 meq/L (ref 3.5–5.1)
Sodium: 136 meq/L (ref 135–145)
Total Bilirubin: 0.4 mg/dL (ref 0.2–1.2)
Total Protein: 7.2 g/dL (ref 6.0–8.3)

## 2024-10-22 LAB — B12 AND FOLATE PANEL
Folate: 15.5 ng/mL (ref >5.9)
Vitamin B-12: 377 pg/mL (ref 211–911)

## 2024-10-22 LAB — IRON: Iron: 69 ug/dL (ref 42–145)

## 2024-10-22 LAB — T3, FREE: T3, Free: 3.3 pg/mL (ref 2.3–4.2)

## 2024-10-22 LAB — HCG, QUANTITATIVE, PREGNANCY: Quantitative HCG: <0.6 m[IU]/mL

## 2024-10-22 LAB — TSH: TSH: 0.67 u[IU]/mL (ref 0.35–5.50)

## 2024-10-22 LAB — FERRITIN: Ferritin: 55.3 ng/mL (ref 10.0–291.0)

## 2024-10-22 LAB — T4, FREE: Free T4: 0.72 ng/dL (ref 0.60–1.60)

## 2024-10-22 NOTE — Assessment & Plan Note (Signed)
 Fatigue with associated microcytic anemia, rectal bleeding, and gastrointestinal history Persistent fatigue and dyspnea likely due to microcytic anemia from iron  deficiency. Recent rectal bleeding suggests gastrointestinal bleeding. No recent gastroenterology follow-up since last year's endoscopy. - Ordered lab tests for anemia status. - Referred to Dr. Stacia for gastroenterology evaluation and possible endoscopy and colonoscopy. - Advised hydration and increased fiber intake.

## 2024-10-22 NOTE — Progress Notes (Signed)
 Established Patient Office Visit  Subjective   Patient ID: Anita Woods, female    DOB: 1991-07-30  Age: 33 y.o. MRN: [id number]  Chief Complaint  Patient presents with   Medical Management of Chronic Issues    Follow up on iron  levels due to fatigue, SOB    Discussed the use of AI scribe software for clinical note transcription with the patient, who gave verbal consent to proceed.  History of Present Illness Anita Woods is a 33 year old female with iron  deficiency anemia who presents with increased fatigue.  Fatigue and associated symptoms - Several months of worsening fatigue despite adequate sleep - Low energy with desire to lie down most of the day - Occasional shortness of breath - Dizziness when standing  Iron  deficiency anemia management - Received iron  infusions last December - Not currently taking iron  supplements  Gastrointestinal bleeding and symptoms - History of bleeding esophageal polyps and hernias, previously treated - Sees bright red blood in stool about once a month - No black or bloody vomit - No recent heartburn or pain - Occasional nausea - Has not followed up with gastroenterologist since last procedure  Constipation - Constipation usually associated with iron  supplements, though not currently taking iron  - Increasing water intake - Eats fruit daily      ROS: see HPI    Objective:     BP 118/76   Pulse 82   Temp 98 F (36.7 C) (Temporal)   Ht 5' 8 (1.727 m)   Wt 142 lb (64.4 kg)   SpO2 99%   BMI 21.59 kg/m    Physical Exam Vitals reviewed.  Constitutional:      General: She is not in acute distress.    Appearance: Normal appearance.  HENT:     Head: Normocephalic and atraumatic.  Cardiovascular:     Rate and Rhythm: Normal rate and regular rhythm.     Pulses: Normal pulses.     Heart sounds: Normal heart sounds.  Pulmonary:     Effort: Pulmonary effort is normal.     Breath sounds: Normal breath sounds.  Skin:     General: Skin is warm and dry.  Neurological:     General: No focal deficit present.     Mental Status: She is alert and oriented to person, place, and time.  Psychiatric:        Mood and Affect: Mood normal.        Behavior: Behavior normal.        Judgment: Judgment normal.      Results for orders placed or performed in visit on 10/22/24  B-HCG Quant  Result Value Ref Range   Quantitative HCG <0.60 mIU/ml      The ASCVD Risk score (Arnett DK, et al., 2019) failed to calculate for the following reasons:   The 2019 ASCVD risk score is only valid for ages 26 to 52    Assessment & Plan:   Problem List Items Addressed This Visit       Other   Dizziness - Primary   Relevant Orders   CBC with Differential/Platelet   Iron    Ferritin   TSH   T3, free   T4, free   Comprehensive metabolic panel with GFR   Hemoglobin A1c   Vitamin D (25 hydroxy)   B12 and Folate Panel   hCG, serum, qualitative   B-HCG Quant (Completed)   Other Visit Diagnoses       Fatigue, unspecified type  Relevant Orders   CBC with Differential/Platelet   Iron    Ferritin   TSH   T3, free   T4, free   Comprehensive metabolic panel with GFR   Hemoglobin A1c   Vitamin D  (25 hydroxy)   B12 and Folate Panel   hCG, serum, qualitative   B-HCG Quant (Completed)      Assessment and Plan Assessment & Plan Fatigue with associated microcytic anemia, rectal bleeding, and gastrointestinal history Persistent fatigue and dyspnea likely due to microcytic anemia from iron  deficiency. Recent rectal bleeding suggests gastrointestinal bleeding. No recent gastroenterology follow-up since last year's endoscopy. - Ordered lab tests for anemia status. - Referred to Dr. Stacia for gastroenterology evaluation and possible endoscopy and colonoscopy. - Advised hydration and increased fiber intake.  Constipation Recent constipation possibly due to dietary factors or hydration. No iron  supplementation  currently, which can cause constipation. - Recommended Metamucil. - Advised water intake of 80-100 ounces daily. - Suggested Colace if Metamucil ineffective. - Recommended MiraLAX if Colace ineffective.    Return in about 6 months (around 04/22/2025) for CPE with Acquanetta Cabanilla.    Lauraine FORBES Pereyra, NP

## 2024-10-22 NOTE — Assessment & Plan Note (Signed)
 Labs ordered, further recommendations may be made based upon his results.

## 2024-10-23 LAB — VITAMIN D 25 HYDROXY (VIT D DEFICIENCY, FRACTURES): VITD: 26.9 ng/mL — ABNORMAL LOW (ref 30.00–100.00)

## 2024-10-23 LAB — HEMOGLOBIN A1C: Hgb A1c MFr Bld: 5 % (ref 4.6–6.5)

## 2024-10-26 ENCOUNTER — Ambulatory Visit: Payer: Self-pay | Admitting: Nurse Practitioner

## 2024-10-26 DIAGNOSIS — E559 Vitamin D deficiency, unspecified: Secondary | ICD-10-CM

## 2024-10-26 MED ORDER — VITAMIN D (ERGOCALCIFEROL) 1.25 MG (50000 UNIT) PO CAPS: 50000.0000 [IU] | ORAL_CAPSULE | ORAL | 0 refills | Status: AC

## 2024-11-10 ENCOUNTER — Ambulatory Visit: Payer: Self-pay | Admitting: Nurse Practitioner

## 2025-02-02 ENCOUNTER — Other Ambulatory Visit

## 2025-02-02 ENCOUNTER — Ambulatory Visit: Admitting: Hematology and Oncology
